# Patient Record
Sex: Female | Born: 1975 | Race: White | Hispanic: No | Marital: Single | Smoking: Current every day smoker
Health system: Southern US, Community
[De-identification: ages and names within clinical notes are randomized; demographics above are authoritative.]

## PROBLEM LIST (undated history)

## (undated) DIAGNOSIS — I10 Essential (primary) hypertension: Secondary | ICD-10-CM

---

## 2009-05-17 ENCOUNTER — Emergency Department (HOSPITAL_COMMUNITY): Admission: EM | Admit: 2009-05-17 | Discharge: 2009-05-17 | Payer: Self-pay | Admitting: Emergency Medicine

## 2010-11-25 LAB — URINALYSIS, ROUTINE W REFLEX MICROSCOPIC
Ketones, ur: NEGATIVE mg/dL
Protein, ur: NEGATIVE mg/dL
Urobilinogen, UA: 0.2 mg/dL (ref 0.0–1.0)

## 2010-11-25 LAB — CBC
HCT: 41.9 % (ref 36.0–46.0)
Hemoglobin: 13.8 g/dL (ref 12.0–15.0)
MCHC: 32.9 g/dL (ref 30.0–36.0)
RBC: 4.42 MIL/uL (ref 3.87–5.11)
RDW: 14.1 % (ref 11.5–15.5)

## 2010-11-25 LAB — COMPREHENSIVE METABOLIC PANEL
ALT: 15 U/L (ref 0–35)
Alkaline Phosphatase: 68 U/L (ref 39–117)
BUN: 5 mg/dL — ABNORMAL LOW (ref 6–23)
CO2: 22 mEq/L (ref 19–32)
GFR calc non Af Amer: >60 mL/min (ref >60)
Glucose, Bld: 98 mg/dL (ref 70–99)
Potassium: 3.5 mEq/L (ref 3.5–5.1)
Sodium: 137 mEq/L (ref 135–145)
Total Protein: 7.9 g/dL (ref 6.0–8.3)

## 2010-11-25 LAB — URINE MICROSCOPIC-ADD ON

## 2010-11-25 LAB — WET PREP, GENITAL: Yeast Wet Prep HPF POC: NONE SEEN

## 2010-11-25 LAB — POCT PREGNANCY, URINE: Preg Test, Ur: NEGATIVE

## 2010-11-25 LAB — DIFFERENTIAL
Basophils Absolute: 0.1 10*3/uL (ref 0.0–0.1)
Basophils Relative: 0 % (ref 0–1)
Eosinophils Absolute: 0.1 10*3/uL (ref 0.0–0.7)
Monocytes Relative: 7 % (ref 3–12)
Neutro Abs: 6.1 10*3/uL (ref 1.7–7.7)
Neutrophils Relative %: 49 % (ref 43–77)

## 2010-11-25 LAB — GC/CHLAMYDIA PROBE AMP, GENITAL: Chlamydia, DNA Probe: NEGATIVE

## 2013-01-15 ENCOUNTER — Encounter (HOSPITAL_COMMUNITY): Payer: Self-pay | Admitting: Emergency Medicine

## 2013-01-15 ENCOUNTER — Emergency Department (HOSPITAL_COMMUNITY)
Admission: EM | Admit: 2013-01-15 | Discharge: 2013-01-15 | Disposition: A | Discharge status: Home / Self Care | Payer: Medicaid Other | Source: Home / Self Care | Attending: Family Medicine | Admitting: Family Medicine

## 2013-01-15 DIAGNOSIS — L02219 Cutaneous abscess of trunk, unspecified: Secondary | ICD-10-CM

## 2013-01-15 DIAGNOSIS — R1900 Intra-abdominal and pelvic swelling, mass and lump, unspecified site: Secondary | ICD-10-CM

## 2013-01-15 DIAGNOSIS — L02214 Cutaneous abscess of groin: Secondary | ICD-10-CM

## 2013-01-15 HISTORY — DX: Essential (primary) hypertension: I10

## 2013-01-15 LAB — POCT URINALYSIS DIP (DEVICE)
Hgb urine dipstick: NEGATIVE
Ketones, ur: NEGATIVE mg/dL
Protein, ur: NEGATIVE mg/dL
Specific Gravity, Urine: 1.025 (ref 1.005–1.030)
Urobilinogen, UA: 0.2 mg/dL (ref 0.0–1.0)
pH: 7 (ref 5.0–8.0)

## 2013-01-15 LAB — POCT PREGNANCY, URINE: Preg Test, Ur: NEGATIVE

## 2013-01-15 MED ORDER — TRAMADOL HCL 50 MG PO TABS: 50.0000 mg | ORAL_TABLET | Freq: Four times a day (QID) | ORAL | Status: DC | PRN

## 2013-01-15 MED ORDER — DOXYCYCLINE HYCLATE 100 MG PO CAPS: 100.0000 mg | ORAL_CAPSULE | Freq: Two times a day (BID) | ORAL | Status: DC

## 2013-01-15 MED ORDER — IBUPROFEN 600 MG PO TABS: 600.0000 mg | ORAL_TABLET | Freq: Three times a day (TID) | ORAL | Status: DC | PRN

## 2013-01-15 NOTE — ED Provider Notes (Signed)
History     CSN: [id number]  Arrival date & time 01/15/13  1035   First MD Initiated Contact with Patient 01/15/13 1130      Chief Complaint  Patient presents with  . Abdominal Pain    (Consider location/radiation/quality/duration/timing/severity/associated sxs/prior treatment) HPI Comments: 37 year old smoker female here with the following complaints: #1) right groin pain and swelling for 2 days, area red and tender no spontaneous drainage. #2) patient is a [id number] (1 miscarriage and 8 medical elected abortions) was evaluated for right lower quadrant abdominal discomfort [facility] and "was told to followup at [facility] for follow up of a right lower quadrant mass". She reports long menstrual periods (more than 7 days) associated with cramping and heavy menstruation flow. Denies intermittent bleding. Denies current abdominal pain. Denies dysuria or hematuria. Denies vaginal discharge.LMP 12/22/12   Past Medical History  Diagnosis Date  . Hypertension     Past Surgical History  Procedure Laterality Date  . Cesarean section      No family history on file.  History  Substance Use Topics  . Smoking status: Current Every Day Smoker -- 0.25 packs/day    Types: Cigarettes  . Smokeless tobacco: Not on file  . Alcohol Use: 3.5 oz/week    7 drink(s) per week     Comment: 40 oz of alcohol per day    OB History   Grav Para Term Preterm Abortions TAB SAB Ect Mult Living                  Review of Systems  Constitutional: Negative for fever and chills.  Gastrointestinal: Negative for nausea, vomiting and diarrhea.       No current abdominal pain.  Endocrine: Negative for cold intolerance, heat intolerance, polydipsia, polyphagia and polyuria.  Genitourinary: Negative for urgency, frequency, hematuria, vaginal bleeding, vaginal discharge and pelvic pain.  Skin: Positive for rash.  Neurological: Negative for dizziness and headaches.  All  other systems reviewed and are negative.    Allergies  Review of patient's allergies indicates no known allergies.  Home Medications   Current Outpatient Rx  Name  Route  Sig  Dispense  Refill  . doxycycline (VIBRAMYCIN) 100 MG capsule   Oral   Take 1 capsule (100 mg total) by mouth 2 (two) times daily.   20 capsule   0   . ibuprofen (ADVIL,MOTRIN) 600 MG tablet   Oral   Take 1 tablet (600 mg total) by mouth every 8 (eight) hours as needed for pain.   20 tablet   0   . traMADol (ULTRAM) 50 MG tablet   Oral   Take 1 tablet (50 mg total) by mouth every 6 (six) hours as needed for pain.   15 tablet   0     BP 149/102  Pulse 88  Temp(Src) 98.5 F (36.9 C) (Oral)  Resp 16  SpO2 100%  LMP 12/22/2012  Physical Exam  Nursing note and vitals reviewed. Constitutional: She is oriented to person, place, and time. She appears well-developed and well-nourished. No distress.  HENT:  Head: Normocephalic and atraumatic.  Cardiovascular: Normal heart sounds.   Pulmonary/Chest: Breath sounds normal.  Abdominal: Soft. She exhibits mass. She exhibits no distension. There is no tenderness. There is no guarding.  There is palpable mass in right lower quadrant.  On bimanual exam uterus appears mobile, irregular and enlarged impress a large fibroid towards right side, possible subserosal fibroid as appears very mobile.  Do not impress at the expense of the adnexa. No significant tenderness reported during examination.  Neurological: She is alert and oriented to person, place, and time.  Skin: She is not diaphoretic.  There is a 2x3 cm indurated tender mass with central fluctuation in right lower pubic area close to the groin.     ED Course  INCISION AND DRAINAGE Performed by: Sharin Grave Authorized by: Sharin Grave Consent: Verbal consent obtained. Risks and benefits: risks, benefits and alternatives were discussed Consent given by: patient Patient understanding:  patient states understanding of the procedure being performed Patient consent: the patient's understanding of the procedure matches consent given Type: abscess Body area: anogenital Location details: vulva Anesthesia: local infiltration Local anesthetic: lidocaine 1% with epinephrine Anesthetic total: 2 ml Scalpel size: 11 Complexity: simple Drainage: purulent Drainage amount: moderate Packing material: 1/4 in iodoform gauze Patient tolerance: Patient tolerated the procedure well with no immediate complications.   (including critical care time)  Labs Reviewed  CULTURE, ROUTINE-ABSCESS  POCT URINALYSIS DIP (DEVICE)  POCT PREGNANCY, URINE   No results found.   1. Abscess of groin, right   2. Pelvic mass       MDM  #1: right groin abscess status post incision and drainage. Prescribe doxycycline ibuprofen and tramadol. Patient was asked to return tomorrow for packing removal and wound check.. #2 pelvic mass: Impress related urine fibroid. Patient was referred to [facility] hospital clinic as she would likely required a GYN ultrasound versus other imaging. No acute abdomen findings on examination. Patient with no abdominal pain today. Supportive care and red flags that should prompt her return to medical attention discussed with patient and provided in writing.        Sharin Grave, MD 01/16/13 1512

## 2013-01-15 NOTE — ED Notes (Signed)
Pt c/o RLQ abdominal pain x 2 months. Was seen at [facility] and told to follow up. Two weeks ago pt began to experience painful swelling in RLQ. Hurts to move. No problems with urinating, bowel movements, or menstural cycles. No fever. Pt reports she drinks 40 oz of alcohol per day. Patient is alert and oriented.

## 2013-01-18 LAB — CULTURE, ROUTINE-ABSCESS

## 2013-01-21 NOTE — ED Notes (Signed)
Chart review.

## 2013-03-02 ENCOUNTER — Emergency Department (HOSPITAL_COMMUNITY)
Admission: EM | Admit: 2013-03-02 | Discharge: 2013-03-02 | Disposition: A | Discharge status: Home / Self Care | Payer: Medicaid Other | Attending: Emergency Medicine | Admitting: Emergency Medicine

## 2013-03-02 ENCOUNTER — Emergency Department (HOSPITAL_COMMUNITY): Payer: Medicaid Other

## 2013-03-02 ENCOUNTER — Encounter (HOSPITAL_COMMUNITY): Payer: Self-pay | Admitting: *Deleted

## 2013-03-02 DIAGNOSIS — R350 Frequency of micturition: Secondary | ICD-10-CM | POA: Insufficient documentation

## 2013-03-02 DIAGNOSIS — D259 Leiomyoma of uterus, unspecified: Secondary | ICD-10-CM

## 2013-03-02 DIAGNOSIS — R112 Nausea with vomiting, unspecified: Secondary | ICD-10-CM | POA: Insufficient documentation

## 2013-03-02 DIAGNOSIS — IMO0002 Reserved for concepts with insufficient information to code with codable children: Secondary | ICD-10-CM | POA: Insufficient documentation

## 2013-03-02 DIAGNOSIS — I10 Essential (primary) hypertension: Secondary | ICD-10-CM | POA: Insufficient documentation

## 2013-03-02 DIAGNOSIS — R300 Dysuria: Secondary | ICD-10-CM | POA: Insufficient documentation

## 2013-03-02 DIAGNOSIS — F17200 Nicotine dependence, unspecified, uncomplicated: Secondary | ICD-10-CM | POA: Insufficient documentation

## 2013-03-02 DIAGNOSIS — D649 Anemia, unspecified: Secondary | ICD-10-CM | POA: Insufficient documentation

## 2013-03-02 DIAGNOSIS — R109 Unspecified abdominal pain: Secondary | ICD-10-CM

## 2013-03-02 DIAGNOSIS — Z3202 Encounter for pregnancy test, result negative: Secondary | ICD-10-CM | POA: Insufficient documentation

## 2013-03-02 DIAGNOSIS — Z87442 Personal history of urinary calculi: Secondary | ICD-10-CM | POA: Insufficient documentation

## 2013-03-02 LAB — CBC WITH DIFFERENTIAL/PLATELET
Basophils Relative: 0 % (ref 0–1)
Eosinophils Absolute: 0 10*3/uL (ref 0.0–0.7)
Hemoglobin: 8.5 g/dL — ABNORMAL LOW (ref 12.0–15.0)
Lymphs Abs: 2.8 10*3/uL (ref 0.7–4.0)
MCH: 20.4 pg — ABNORMAL LOW (ref 26.0–34.0)
Neutro Abs: 5.6 10*3/uL (ref 1.7–7.7)
Neutrophils Relative %: 63 % (ref 43–77)
Platelets: 372 10*3/uL (ref 150–400)
RBC: 4.17 MIL/uL (ref 3.87–5.11)

## 2013-03-02 LAB — URINALYSIS, ROUTINE W REFLEX MICROSCOPIC
Bilirubin Urine: NEGATIVE
Glucose, UA: NEGATIVE mg/dL
Ketones, ur: NEGATIVE mg/dL
pH: 6 (ref 5.0–8.0)

## 2013-03-02 LAB — COMPREHENSIVE METABOLIC PANEL
ALT: 14 U/L (ref 0–35)
Albumin: 3.6 g/dL (ref 3.5–5.2)
Alkaline Phosphatase: 79 U/L (ref 39–117)
Chloride: 101 mEq/L (ref 96–112)
Glucose, Bld: 130 mg/dL — ABNORMAL HIGH (ref 70–99)
Potassium: 3.3 mEq/L — ABNORMAL LOW (ref 3.5–5.1)
Sodium: 135 mEq/L (ref 135–145)
Total Bilirubin: 0.3 mg/dL (ref 0.3–1.2)
Total Protein: 7.4 g/dL (ref 6.0–8.3)

## 2013-03-02 LAB — WET PREP, GENITAL

## 2013-03-02 LAB — POCT PREGNANCY, URINE: Preg Test, Ur: NEGATIVE

## 2013-03-02 MED ORDER — IOHEXOL 300 MG/ML  SOLN
100.0000 mL | Freq: Once | INTRAMUSCULAR | Status: AC | PRN
  Administered 2013-03-02: 100 mL via INTRAVENOUS

## 2013-03-02 MED ORDER — FERROUS SULFATE 325 (65 FE) MG PO TABS: 325.0000 mg | ORAL_TABLET | Freq: Every day | ORAL | Status: DC

## 2013-03-02 MED ORDER — MORPHINE SULFATE 4 MG/ML IJ SOLN
4.0000 mg | Freq: Once | INTRAMUSCULAR | Status: AC
  Administered 2013-03-02: 4 mg via INTRAVENOUS
  Filled 2013-03-02: qty 1

## 2013-03-02 MED ORDER — DOCUSATE SODIUM 100 MG PO CAPS: 100.0000 mg | ORAL_CAPSULE | Freq: Two times a day (BID) | ORAL | Status: DC

## 2013-03-02 MED ORDER — ONDANSETRON HCL 4 MG/2ML IJ SOLN
4.0000 mg | Freq: Once | INTRAMUSCULAR | Status: AC
  Administered 2013-03-02: 4 mg via INTRAVENOUS
  Filled 2013-03-02: qty 2

## 2013-03-02 MED ORDER — IBUPROFEN 800 MG PO TABS: 800.0000 mg | ORAL_TABLET | Freq: Three times a day (TID) | ORAL | Status: DC

## 2013-03-02 MED ORDER — IOHEXOL 300 MG/ML  SOLN
25.0000 mL | INTRAMUSCULAR | Status: DC | PRN
  Administered 2013-03-02: 25 mL via ORAL

## 2013-03-02 NOTE — ED Notes (Signed)
Pt is here with right lower abdominal pain that radiates into her back for the last 4 days.  LMP 6/31/14

## 2013-03-02 NOTE — ED Notes (Signed)
Pt stated she "went through this about a month ago at the health clinic and everything was fine. But this pain won't go away."

## 2013-03-02 NOTE — ED Provider Notes (Signed)
History    CSN: [id number] Arrival date & time 03/02/13  1139  First MD Initiated Contact with Patient 03/02/13 1209     Chief Complaint  Patient presents with  . Abdominal Pain   (Consider location/radiation/quality/duration/timing/severity/associated sxs/prior Treatment) HPI Julie Roberson is a(n) 37 y.o. female who presents  Chief complaint of right lower abdominal pain and right flank pain. Ten systems reviewed and are negative for acute change, except as noted in the HPI.  States that she has had intermittent pain on the right side for the past 4 months however over the past 4 days it has become acutely severe.  She states that Sunday she had nausea and  nonbilious nonbloody vomiting associated.  She has no further episodes of this.  Patient complains of dysuria and frequency.  She states that her pain is worse with urination.  She states that it is squeezing and feels that it radiates into her back.  She does have a history of kidney stones however states that this is different.  She denies hematuria, foul odor of urine, urgency.  Patient's last menstrual period was 6/31/2014.  Patient is sexually active with one single female partner.  Her partner has had a facetectomy and that is her means of birth control.  She states that her period.  Are regular.  She has noticed some dyspareunia as worsening over the past week.  She states that she is achy after sex and during her last intercourse had some spotting which is new to her.  Denies fevers, chills, myalgias, arthralgias. Denies DOE, SOB, chest tightness or pressure, radiation to left arm, jaw or back, or diaphoresis. Denies headaches, light headedness, weakness, visual disturbances.  Past Medical History  Diagnosis Date  . Hypertension    Past Surgical History  Procedure Laterality Date  . Cesarean section     No family history on file. History  Substance Use Topics  . Smoking status: Current Every Day Smoker -- 0.25 packs/day    Types: Cigarettes  . Smokeless tobacco: Not on file  . Alcohol Use: 3.5 oz/week    7 drink(s) per week     Comment: 40 oz of alcohol per day   OB History   Grav Para Term Preterm Abortions TAB SAB Ect Mult Living                 Review of Systems Ten systems reviewed and are negative for acute change, except as noted in the HPI.   Allergies  Review of patient's allergies indicates no known allergies.  Home Medications   Current Outpatient Rx  Name  Route  Sig  Dispense  Refill  . ibuprofen (ADVIL,MOTRIN) 600 MG tablet   Oral   Take 1 tablet (600 mg total) by mouth every 8 (eight) hours as needed for pain.   20 tablet   0    BP 182/108  Pulse 95  Temp(Src) 98.3 F (36.8 C)  Resp 18  SpO2 100% Physical Exam Nursing note and vitals reviewed. Constitutional: She is oriented to person, place, and time. She appears well-developed and well-nourished. No distress.  HENT:  Head: Normocephalic and atraumatic.  Eyes: Conjunctivae normal and EOM are normal. Pupils are equal, round, and reactive to light. No scleral icterus.  Neck: Normal range of motion.  Cardiovascular: Normal rate, regular rhythm and normal heart sounds.  Exam reveals no gallop and no friction rub.   No murmur heard. Pulmonary/Chest: Effort normal and breath sounds normal. No respiratory distress.  Abdominal: No tenderness to deep palpation of the abdomen.  Patient is tender to superficial palpation in the right lower rectus abdominis.  It is more firm than the opposite side.  The patient has rectus diastases.  No tenderness in the iliopsoas region.  No CVA tenderness.  No heat redness, swelling or discharge.  No signs of infection. Neurological: She is alert and oriented to person, place, and time.  Skin: Skin is warm and dry. She is not diaphoretic.    ED Course  Procedures (including critical care time) Labs Reviewed  CBC WITH DIFFERENTIAL - Abnormal; Notable for the following:    Hemoglobin 8.5 (*)     HCT 28.5 (*)    MCV 68.3 (*)    MCH 20.4 (*)    MCHC 29.8 (*)    RDW 21.7 (*)    All other components within normal limits  COMPREHENSIVE METABOLIC PANEL - Abnormal; Notable for the following:    Potassium 3.3 (*)    CO2 18 (*)    Glucose, Bld 130 (*)    BUN 4 (*)    All other components within normal limits  URINALYSIS, ROUTINE W REFLEX MICROSCOPIC - Abnormal; Notable for the following:    Specific Gravity, Urine 1.004 (*)    All other components within normal limits  WET PREP, GENITAL  GC/CHLAMYDIA PROBE AMP  POCT PREGNANCY, URINE    Pelvic exam: VULVA: normal appearing vulva with no masses, tenderness or lesions, VAGINA: normal appearing vagina with normal color and discharge, no lesions, CERVIX: normal appearing cervix without discharge or lesions, multiparous os, UTERUS: Not palpabale above pubis, ADNEXA: mass present right side, size 10 cm, exam chaperoned by Julaine Hua.  No results found. 1. Uterine fibroid   2. Anemia   3. Abdominal pain   4. Hypertension     MDM  12:51 PM Filed Vitals:   03/02/13 1143  BP: 182/108  Pulse: 95  Temp: 98.3 F (36.8 C)  Resp: 18   Patient here with complaint of pain.  Her CBC shows she is in a hemoglobin of 8.5.  Last documented hemoglobin was in 2010 and was 13.8 at that time. Patient complains that she has had heavier periods that are lasting longer than normal.  1:55 PM BP 182/108  Pulse 95  Temp(Src) 98.3 F (36.8 C)  Resp 18  SpO2 100% Pelvic exam performed.  Mass is palpable in the right adnexa when pressed it down into the pelvic region.  The patient is very tender when this is done.  It appears to feel approximately 10 cm in size.  Movable.   Filed Vitals:   03/02/13 1422 03/02/13 1424 03/02/13 1430 03/02/13 1500  BP: 157/105  164/105 161/95  Pulse:  75 71 64  Temp:      Resp:      SpO2:  99% 100% 100%    Patient ct/ US show large uterine fibroids. I so not suspect ovarian torsion as the patient's presentation  does not support this diagnosis.  Patient will be discharged with ibuprofen, iron, and colace. She will follow up with [facility] and  [facility]. It also appears to be hypertensive.  Discuss this with her.  She will follow up with community health and wellness.  SHe'll need her hemoglobin and blood pressure checked. Return precautions discussed.  Patient expresses understanding and agrees with plan.  Arthor Captain, PA-C 03/02/13 1955  Arthor Captain, PA-C 03/02/13 1955

## 2013-03-02 NOTE — ED Provider Notes (Signed)
Patient reports tenderness in her right lower abdomen with a small mass that is getting bigger. She relates she noted it about 4 months ago. She denies any fever. It is just superior to an old C-section scar. She denies fever, nausea, or vomiting.  Patient has a proximally 4 cm x 2 cm firm mass that feels like it's in the subcutaneous tissue in the right lower quadrant of the abdomen just superior and lateral to the lateral edge of her C-section scar. It is not in the inguinal region. It appears to be painful to palpation.   Medical screening examination/treatment/procedure(s) were conducted as a shared visit with non-physician practitioner(s) and myself.  I personally evaluated the patient during the encounter  Devoria Albe, MD, Franz Dell, MD 03/02/13 [phone]

## 2013-03-03 LAB — GC/CHLAMYDIA PROBE AMP
CT Probe RNA: NEGATIVE
GC Probe RNA: NEGATIVE

## 2013-03-03 NOTE — ED Provider Notes (Signed)
See prior note   Ward Givens, MD 03/03/13 1304

## 2015-01-06 IMAGING — CT CT ABD-PELV W/ CM
2 of 4 series · 17 of 46 positions shown, 19 images · IV contrast (CONTRAST)
Comparison: Pelvic ultrasound today

CLINICAL DATA: Abdominal pain.

CT ABDOMEN AND PELVIS WITH CONTRAST
TECHNIQUE: Multidetector CT imaging of the abdomen and pelvis was
performed following the standard protocol during bolus
administration of intravenous contrast.
Contrast: 100mL OMNIPAQUE IOHEXOL 300 MG/ML  SOLN

[Series 2: routine · axial · 0.81mm/px · z∈[-444,-64]mm · 14 of 84 slices shown, 16 images]
[im 4/84  soft-tissue]
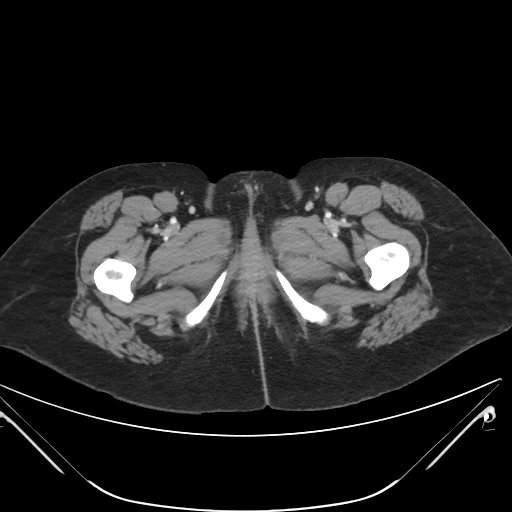
[im 4/84  bone]
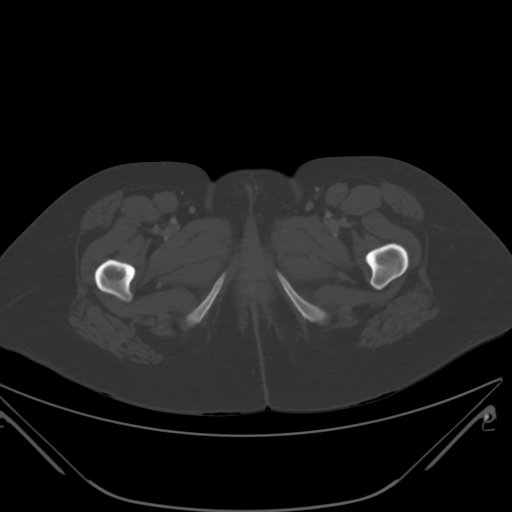
[im 11/84  soft-tissue]
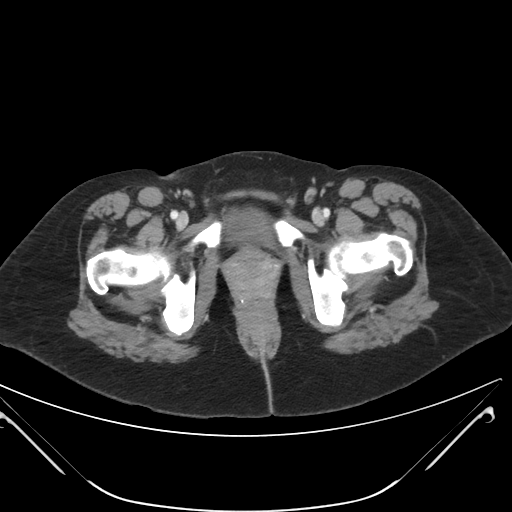
[im 18/84  soft-tissue]
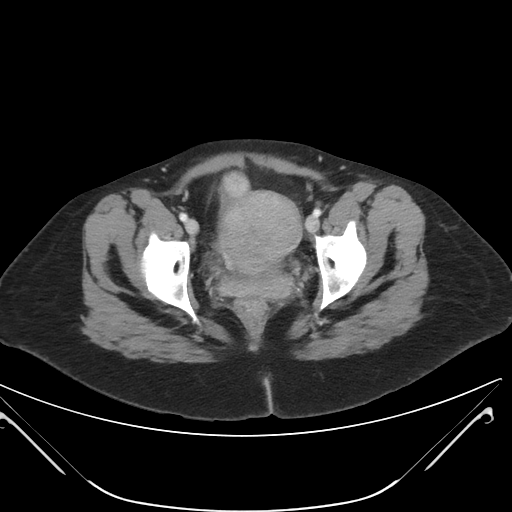
[im 21/84  soft-tissue]
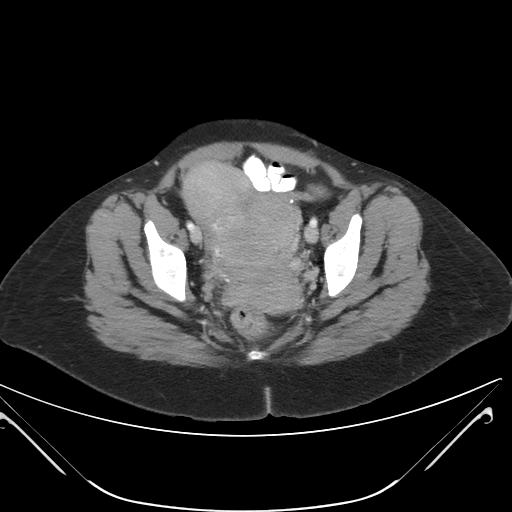
[im 28/84  soft-tissue]
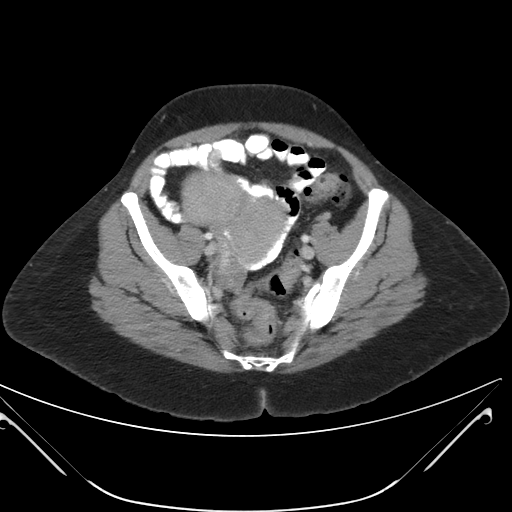
[im 35/84  soft-tissue]
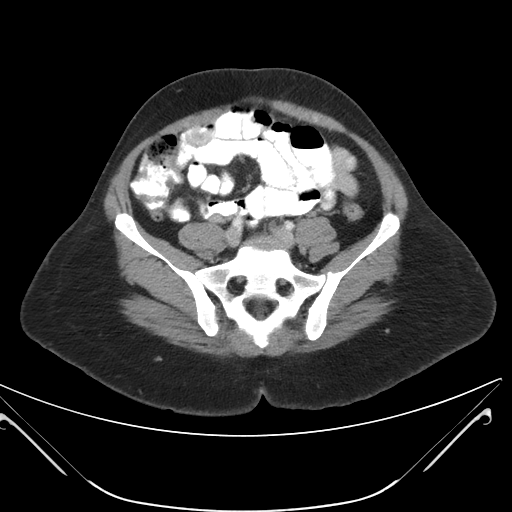
[im 39/84  soft-tissue]
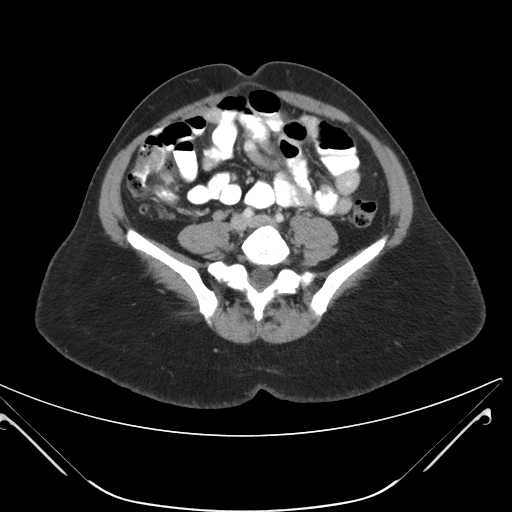
[im 45/84  soft-tissue]
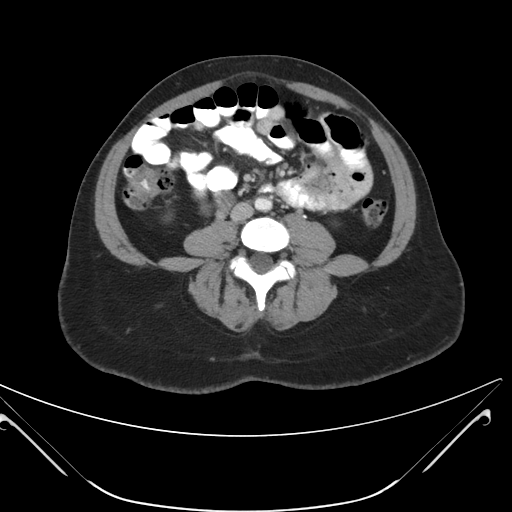
[im 49/84  soft-tissue]
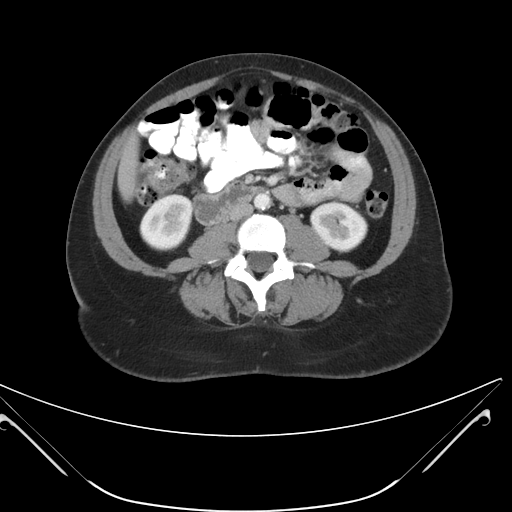
[im 49/84  bone]
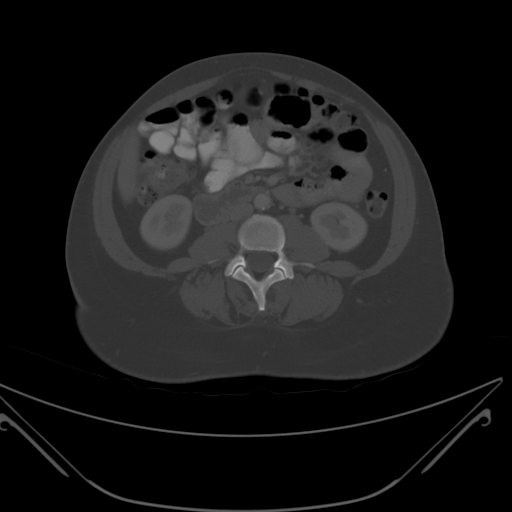
[im 56/84  soft-tissue]
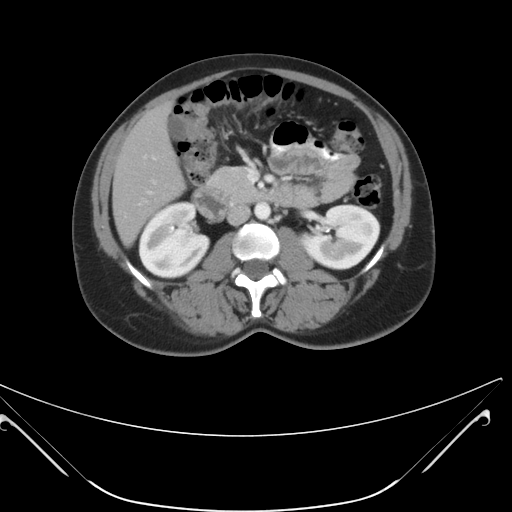
[im 63/84  soft-tissue]
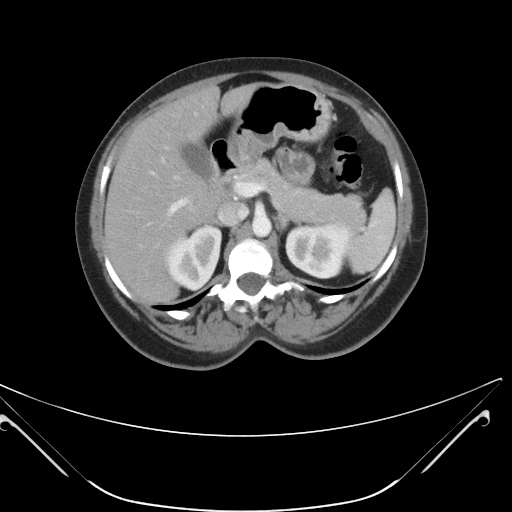
[im 66/84  soft-tissue]
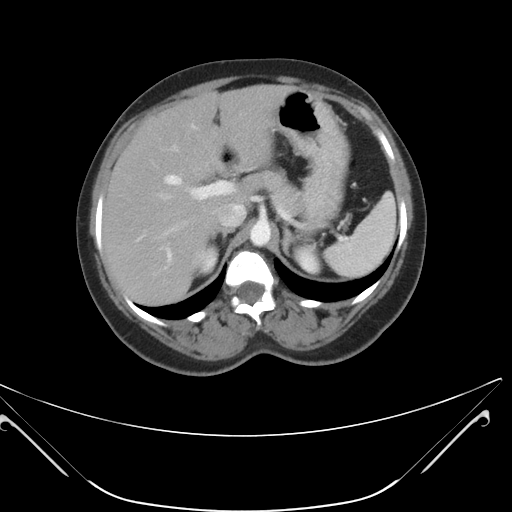
[im 73/84  soft-tissue]
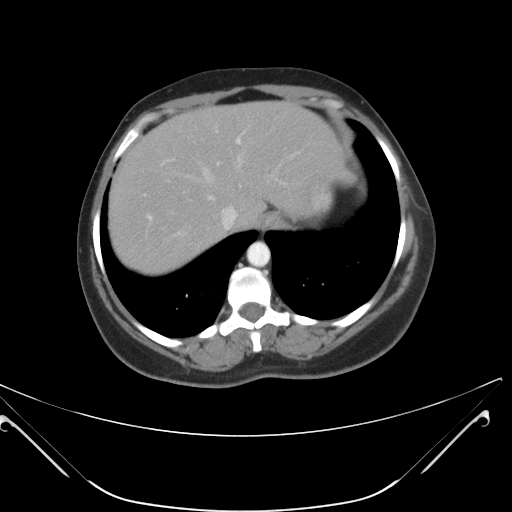
[im 80/84  soft-tissue]
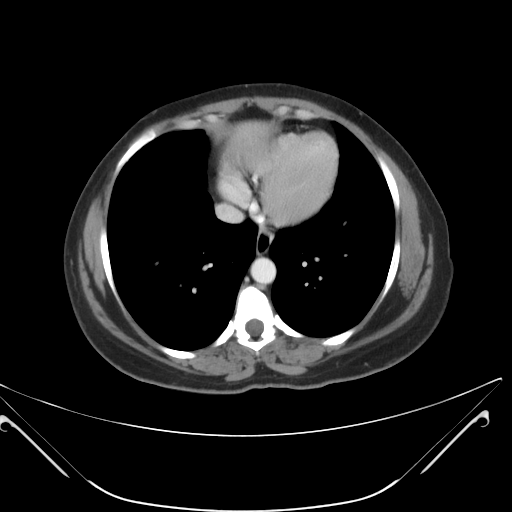

[mpr, coronals, coronal · coronal · 0.81mm/px · 3 of 97 slices shown]
[im 33/97  soft-tissue]
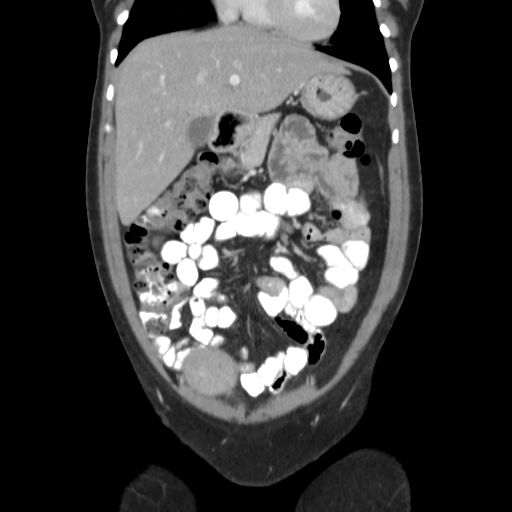
[im 43/97  soft-tissue]
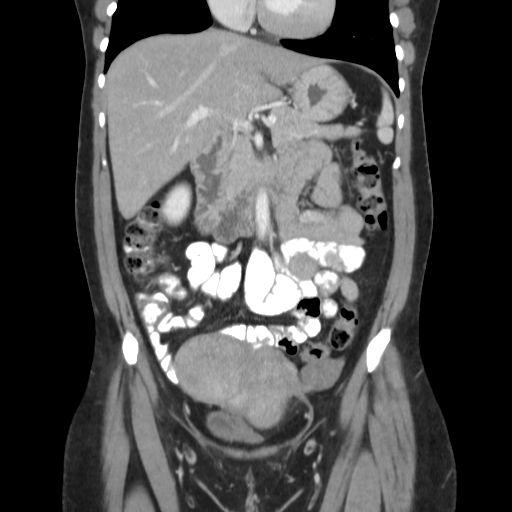
[im 54/97  soft-tissue]
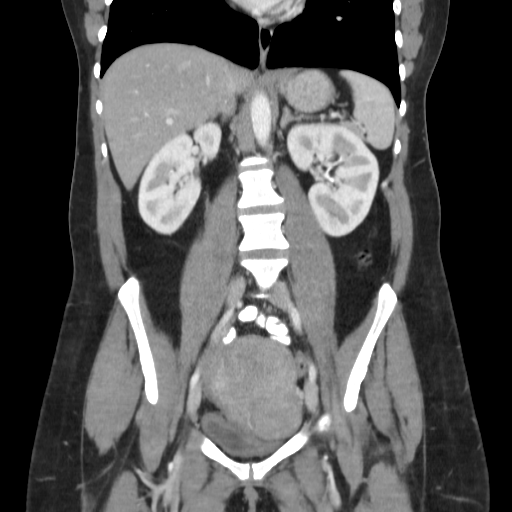

[17 of 46 positions shown; findings below may reference images not displayed]

FINDINGS: Lung bases are clear.  Liver gallbladder and bile ducts
are normal.  Pancreas and spleen are normal.  The kidneys are
normal without obstruction or mass lesion.  Mild aortic
atherosclerotic disease without aneurysm.

Negative for bowel obstruction or bowel thickening.  Normal
appendix.

Multiple uterine fibroids are present.  The largest fibroid in the
right fundus measures 6.2 cm.  Additional 5 cm fibroids in the body
of the uterus.  Lower uterine segment fibroid on the left measures
5.6 cm.

No free fluid.  No adenopathy.
IMPRESSION: Uterine fibroids.  Otherwise no acute abnormality.

## 2015-02-05 ENCOUNTER — Emergency Department (HOSPITAL_COMMUNITY)
Admission: EM | Admit: 2015-02-05 | Discharge: 2015-02-05 | Disposition: A | Discharge status: Home / Self Care | Payer: Medicaid Other | Attending: Emergency Medicine | Admitting: Emergency Medicine

## 2015-02-05 ENCOUNTER — Encounter (HOSPITAL_COMMUNITY): Payer: Self-pay | Admitting: *Deleted

## 2015-02-05 ENCOUNTER — Emergency Department (HOSPITAL_COMMUNITY)
Admission: EM | Admit: 2015-02-05 | Discharge: 2015-02-05 | Disposition: A | Discharge status: Nursing Home (Includes ICF) | Payer: Medicaid Other | Source: Home / Self Care | Attending: Family Medicine | Admitting: Family Medicine

## 2015-02-05 ENCOUNTER — Encounter (HOSPITAL_COMMUNITY): Payer: Self-pay

## 2015-02-05 DIAGNOSIS — R110 Nausea: Secondary | ICD-10-CM | POA: Insufficient documentation

## 2015-02-05 DIAGNOSIS — N898 Other specified noninflammatory disorders of vagina: Secondary | ICD-10-CM | POA: Diagnosis not present

## 2015-02-05 DIAGNOSIS — R1030 Lower abdominal pain, unspecified: Secondary | ICD-10-CM | POA: Insufficient documentation

## 2015-02-05 DIAGNOSIS — Z3202 Encounter for pregnancy test, result negative: Secondary | ICD-10-CM | POA: Insufficient documentation

## 2015-02-05 DIAGNOSIS — K5900 Constipation, unspecified: Secondary | ICD-10-CM | POA: Diagnosis not present

## 2015-02-05 DIAGNOSIS — I10 Essential (primary) hypertension: Secondary | ICD-10-CM | POA: Diagnosis not present

## 2015-02-05 DIAGNOSIS — Z720 Tobacco use: Secondary | ICD-10-CM | POA: Diagnosis not present

## 2015-02-05 DIAGNOSIS — Z79899 Other long term (current) drug therapy: Secondary | ICD-10-CM | POA: Insufficient documentation

## 2015-02-05 DIAGNOSIS — R109 Unspecified abdominal pain: Secondary | ICD-10-CM

## 2015-02-05 DIAGNOSIS — Z9889 Other specified postprocedural states: Secondary | ICD-10-CM | POA: Insufficient documentation

## 2015-02-05 DIAGNOSIS — R1900 Intra-abdominal and pelvic swelling, mass and lump, unspecified site: Secondary | ICD-10-CM

## 2015-02-05 LAB — COMPREHENSIVE METABOLIC PANEL
ALBUMIN: 3.6 g/dL (ref 3.5–5.0)
ALT: 23 U/L (ref 14–54)
ANION GAP: 7 (ref 5–15)
AST: 40 U/L (ref 15–41)
Alkaline Phosphatase: 64 U/L (ref 38–126)
BILIRUBIN TOTAL: 0.5 mg/dL (ref 0.3–1.2)
BUN: <5 mg/dL — ABNORMAL LOW (ref 6–20)
CALCIUM: 9.8 mg/dL (ref 8.9–10.3)
CHLORIDE: 111 mmol/L (ref 101–111)
CO2: 20 mmol/L — ABNORMAL LOW (ref 22–32)
CREATININE: 0.45 mg/dL (ref 0.44–1.00)
GFR calc Af Amer: >60 mL/min (ref >60)
GFR calc non Af Amer: >60 mL/min (ref >60)
Glucose, Bld: 83 mg/dL (ref 65–99)
Potassium: 3.7 mmol/L (ref 3.5–5.1)
SODIUM: 138 mmol/L (ref 135–145)
Total Protein: 7 g/dL (ref 6.5–8.1)

## 2015-02-05 LAB — CBC WITH DIFFERENTIAL/PLATELET
BASOS ABS: 0.1 10*3/uL (ref 0.0–0.1)
Basophils Relative: 1 % (ref 0–1)
EOS ABS: 0 10*3/uL (ref 0.0–0.7)
Eosinophils Relative: 0 % (ref 0–5)
HCT: 27.4 % — ABNORMAL LOW (ref 36.0–46.0)
Hemoglobin: 8.3 g/dL — ABNORMAL LOW (ref 12.0–15.0)
LYMPHS PCT: 31 % (ref 12–46)
Lymphs Abs: 3.4 10*3/uL (ref 0.7–4.0)
MCH: 23.2 pg — ABNORMAL LOW (ref 26.0–34.0)
MCHC: 30.3 g/dL (ref 30.0–36.0)
MCV: 76.5 fL — ABNORMAL LOW (ref 78.0–100.0)
Monocytes Absolute: 0.8 10*3/uL (ref 0.1–1.0)
Monocytes Relative: 7 % (ref 3–12)
NEUTROS PCT: 61 % (ref 43–77)
Neutro Abs: 6.7 10*3/uL (ref 1.7–7.7)
Platelets: 221 10*3/uL (ref 150–400)
RBC: 3.58 MIL/uL — ABNORMAL LOW (ref 3.87–5.11)
RDW: 30 % — AB (ref 11.5–15.5)
WBC: 11 10*3/uL — AB (ref 4.0–10.5)

## 2015-02-05 LAB — POCT URINALYSIS DIP (DEVICE)
Bilirubin Urine: NEGATIVE
GLUCOSE, UA: NEGATIVE mg/dL
HGB URINE DIPSTICK: NEGATIVE
Ketones, ur: NEGATIVE mg/dL
Leukocytes, UA: NEGATIVE
NITRITE: NEGATIVE
PROTEIN: NEGATIVE mg/dL
UROBILINOGEN UA: 0.2 mg/dL (ref 0.0–1.0)
pH: 6 (ref 5.0–8.0)

## 2015-02-05 LAB — URINALYSIS, ROUTINE W REFLEX MICROSCOPIC
Bilirubin Urine: NEGATIVE
Glucose, UA: NEGATIVE mg/dL
Hgb urine dipstick: NEGATIVE
KETONES UR: NEGATIVE mg/dL
LEUKOCYTES UA: NEGATIVE
NITRITE: NEGATIVE
PROTEIN: NEGATIVE mg/dL
Specific Gravity, Urine: 1.008 (ref 1.005–1.030)
UROBILINOGEN UA: 0.2 mg/dL (ref 0.0–1.0)
pH: 5.5 (ref 5.0–8.0)

## 2015-02-05 LAB — CBC AND DIFFERENTIAL
HEMATOCRIT: 27 % — AB (ref 36–46)
Hemoglobin: 8.3 g/dL — AB (ref 12.0–16.0)
WBC: 11 10*3/mL

## 2015-02-05 LAB — POCT PREGNANCY, URINE: Preg Test, Ur: NEGATIVE

## 2015-02-05 LAB — POC URINE PREG, ED: Preg Test, Ur: NEGATIVE

## 2015-02-05 LAB — WET PREP, GENITAL
TRICH WET PREP: NONE SEEN
Yeast Wet Prep HPF POC: NONE SEEN

## 2015-02-05 LAB — LIPASE, BLOOD: LIPASE: 183 U/L — AB (ref 22–51)

## 2015-02-05 LAB — BASIC METABOLIC PANEL: BUN: 5 mg/dL (ref 4–21)

## 2015-02-05 MED ORDER — FENTANYL CITRATE (PF) 100 MCG/2ML IJ SOLN
50.0000 ug | Freq: Once | INTRAMUSCULAR | Status: AC
  Administered 2015-02-05: 50 ug via INTRAVENOUS
  Filled 2015-02-05: qty 2

## 2015-02-05 NOTE — ED Notes (Signed)
Pt reports lower abdominal cramping with N/V x 3 weeks with abdominal bloating. Pt reports yellow vaginal discharge. Denies urinary symptoms. Denies fever/chills.

## 2015-02-05 NOTE — ED Provider Notes (Signed)
CSN: [id number]     Arrival date & time 02/05/15  1442 History   This chart was scribed for Comer Locket, PA-C working with No att. providers found by Mercy Moore, ED Scribe. This patient was seen in room TR01C/TR01C and the patient's care was started at 6:02 PM.   Chief Complaint  Patient presents with  . Abdominal Pain   The history is provided by the patient. No language interpreter was used.   HPI Comments: Julie Roberson is a 39 y.o. female with PMHx of Hypertension who presents to the Emergency Department as a transfer from [facility] for further evaluation of her suprapubic abdominal pain. Associated symptoms include vomiting, diarrhea, yellow vaginal discharge, and abnormal recent menstrual period lasting for 12 days. Patient states that she suspects she has an umnilical hernia reporting protrusion of her belly button. Patient reports aggravation of her abdominal pain with sitting; she reports that pain medication and beer distract her enough so she can sleep, she denies alleviating factors. Patient denies association of her abdominal pain with eating.  Patient evaluated at [facility] today for complaint of worsening suprapubic, cramping abdominal pain ongoing for three weeks; diagnosed with pelvic mass.  Patient reports that she's scheduled an appointment at [facility] for August; the earliest they could get her in.   Past Medical History  Diagnosis Date  . Hypertension    Past Surgical History  Procedure Laterality Date  . Cesarean section     No family history on file. History  Substance Use Topics  . Smoking status: Current Every Day Smoker -- 0.25 packs/day    Types: Cigarettes  . Smokeless tobacco: Not on file  . Alcohol Use: 3.5 oz/week    7 drink(s) per week     Comment: 40 oz of alcohol per day   OB History    No data available     Review of Systems  Constitutional: Negative for fever and chills.  HENT: Negative for congestion, rhinorrhea and  sore throat.   Eyes: Negative for visual disturbance.  Respiratory: Negative for cough, shortness of breath and wheezing.   Gastrointestinal: Positive for nausea, abdominal pain and constipation. Negative for vomiting and diarrhea.  Endocrine: Negative for polyuria.  Genitourinary: Positive for vaginal discharge and menstrual problem. Negative for dysuria and hematuria.  Musculoskeletal: Negative for back pain and joint swelling.  Skin: Negative for rash.  Allergic/Immunologic: Negative for immunocompromised state.  Neurological: Negative for headaches.  Hematological: Does not bruise/bleed easily.  Psychiatric/Behavioral: Negative for confusion.  All other systems reviewed and are negative.     Allergies  Review of patient's allergies indicates no known allergies.  Home Medications   Prior to Admission medications   Medication Sig Start Date End Date Taking? Authorizing Provider  docusate sodium (COLACE) 100 MG capsule Take 1 capsule (100 mg total) by mouth every 12 (twelve) hours. 03/02/13   Margarita Mail, PA-C  ferrous sulfate 325 (65 FE) MG tablet Take 1 tablet (325 mg total) by mouth daily with breakfast. 03/02/13   Margarita Mail, PA-C  ibuprofen (ADVIL,MOTRIN) 600 MG tablet Take 1 tablet (600 mg total) by mouth every 8 (eight) hours as needed for pain. 01/15/13   Adlih Moreno-Coll, MD  ibuprofen (ADVIL,MOTRIN) 800 MG tablet Take 1 tablet (800 mg total) by mouth 3 (three) times daily. 03/02/13   Margarita Mail, PA-C   Triage Vitals: BP 176/106 mmHg  Pulse 84  Temp(Src) 98.8 F (37.1 C) (Oral)  Resp 18  SpO2 100%  LMP 01/08/2015 Physical Exam  Constitutional: She is oriented to person, place, and time. She appears well-developed and well-nourished. No distress.  HENT:  Head: Normocephalic and atraumatic.  Eyes: EOM are normal.  Neck: Neck supple. No tracheal deviation present.  Cardiovascular: Normal rate and regular rhythm.   Pulmonary/Chest: Effort normal and breath  sounds normal. No respiratory distress. She has no wheezes. She has no rales.  Abdominal: Soft. There is tenderness.  Soft, with tenderness over suprapubic reigon. No other lesions or deformities.   Musculoskeletal: Normal range of motion.  Neurological: She is alert and oriented to person, place, and time.  Skin: Skin is warm and dry.  Psychiatric: She has a normal mood and affect. Her behavior is normal.  Nursing note and vitals reviewed.   ED Course  Procedures (including critical care time)  COORDINATION OF CARE: 6:14 PM- Pelvic exam performed. Discussed treatment plan with patient at bedside and patient agreed to plan.   Labs Review Labs Reviewed  WET PREP, GENITAL - Abnormal; Notable for the following:    Clue Cells Wet Prep HPF POC FEW (*)    WBC, Wet Prep HPF POC RARE (*)    All other components within normal limits  CBC WITH DIFFERENTIAL/PLATELET - Abnormal; Notable for the following:    WBC 11.0 (*)    RBC 3.58 (*)    Hemoglobin 8.3 (*)    HCT 27.4 (*)    MCV 76.5 (*)    MCH 23.2 (*)    RDW 30.0 (*)    All other components within normal limits  COMPREHENSIVE METABOLIC PANEL - Abnormal; Notable for the following:    CO2 20 (*)    BUN <5 (*)    All other components within normal limits  LIPASE, BLOOD - Abnormal; Notable for the following:    Lipase 183 (*)    All other components within normal limits  URINALYSIS, ROUTINE W REFLEX MICROSCOPIC (NOT AT [facility])  POC URINE PREG, ED  GC/CHLAMYDIA PROBE AMP ([place]) NOT AT [facility]    Imaging Review No results found.   EKG Interpretation None     Meds given in ED:  Medications  fentaNYL (SUBLIMAZE) injection 50 mcg (50 mcg Intravenous Given 02/05/15 1828)    Discharge Medication List as of 02/05/2015  6:57 PM     Filed Vitals:   02/05/15 1454 02/05/15 1708  BP: 157/103 176/106  Pulse: 84 84  Temp: 98.8 F (37.1 C)   TempSrc: Oral   Resp: 20 18  SpO2: 100% 100%    MDM  Vitals stable -afebrile Pt  resting comfortably in ED. Labs are noncontributory. DDX--patient with nonspecific lower abdominal cramping with intermittent nausea and vomiting for the past 3 weeks. Labs are noncontributory. Benign abdominal exam. Low suspicion for appendicitis. Patient feels much better after administration of analgesia in the ED. States she would prefer to follow up with gynecology for removal of fibroids. Does not want to have further imaging completed at this time.  I discussed all relevant lab findings and imaging results with pt and they verbalized understanding. Discussed f/u with PCP within 48 hrs and return precautions, pt very amenable to plan. Patient stable, in good condition and ambulates out of the ED without difficulty.  Final diagnoses:  Abdominal discomfort   I personally performed the services described in this documentation, which was scribed in my presence. The recorded information has been reviewed and is accurate.    Comer Locket, PA-C 02/05/15 2259  Elnora Morrison, MD  02/06/15 0134 

## 2015-02-05 NOTE — ED Provider Notes (Addendum)
CSN: [id number]     Arrival date & time 02/05/15  1328 History   First MD Initiated Contact with Patient 02/05/15 1410     Chief Complaint  Patient presents with  . Abdominal Pain   (Consider location/radiation/quality/duration/timing/severity/associated sxs/prior Treatment) Patient is a 39 y.o. female presenting with abdominal pain. The history is provided by the patient.  Abdominal Pain Pain location:  Suprapubic Pain quality: cramping   Pain radiates to:  Back Onset quality:  Gradual Duration:  3 weeks Progression:  Worsening Chronicity:  New Context comment:  Lmp lasting 12d was 5/20. Associated symptoms: constipation, nausea and vaginal discharge   Associated symptoms: no dysuria, no fever, no vaginal bleeding and no vomiting   Risk factors comment:  H/o fibroids. told it would go away.   Past Medical History  Diagnosis Date  . Hypertension    Past Surgical History  Procedure Laterality Date  . Cesarean section     History reviewed. No pertinent family history. History  Substance Use Topics  . Smoking status: Current Every Day Smoker -- 0.25 packs/day    Types: Cigarettes  . Smokeless tobacco: Not on file  . Alcohol Use: 3.5 oz/week    7 drink(s) per week     Comment: 40 oz of alcohol per day   OB History    No data available     Review of Systems  Constitutional: Negative for fever.  Gastrointestinal: Positive for nausea, abdominal pain and constipation. Negative for vomiting.  Genitourinary: Positive for vaginal discharge, menstrual problem and pelvic pain. Negative for dysuria and vaginal bleeding.    Allergies  Review of patient's allergies indicates no known allergies.  Home Medications   Prior to Admission medications   Medication Sig Start Date End Date Taking? Authorizing Provider  docusate sodium (COLACE) 100 MG capsule Take 1 capsule (100 mg total) by mouth every 12 (twelve) hours. 03/02/13   Margarita Mail, PA-C  ferrous sulfate 325 (65 FE) MG  tablet Take 1 tablet (325 mg total) by mouth daily with breakfast. 03/02/13   Margarita Mail, PA-C  ibuprofen (ADVIL,MOTRIN) 600 MG tablet Take 1 tablet (600 mg total) by mouth every 8 (eight) hours as needed for pain. 01/15/13   Adlih Moreno-Coll, MD  ibuprofen (ADVIL,MOTRIN) 800 MG tablet Take 1 tablet (800 mg total) by mouth 3 (three) times daily. 03/02/13   Abigail Harris, PA-C   BP 152/101 mmHg  Pulse 90  Temp(Src) 98.8 F (37.1 C) (Oral)  Resp 16  SpO2 100%  LMP 01/08/2015 Physical Exam  Constitutional: She is oriented to person, place, and time. She appears well-developed and well-nourished.  Neck: Normal range of motion. Neck supple.  Cardiovascular: Normal rate and regular rhythm.   Pulmonary/Chest: Effort normal.  Abdominal: Soft. Bowel sounds are normal. She exhibits mass. She exhibits no distension. There is tenderness in the suprapubic area. There is no rebound and no guarding.    Lymphadenopathy:    She has no cervical adenopathy.  Neurological: She is alert and oriented to person, place, and time.  Skin: Skin is warm and dry.  Nursing note and vitals reviewed.   ED Course  Procedures (including critical care time) Labs Review Labs Reviewed  POCT URINALYSIS DIP (DEVICE)  POCT PREGNANCY, URINE   U/a and upreg--neg. Imaging Review No results found.   MDM   1. Pelvic mass in female       Billy Fischer, MD 02/05/15 [place], MD 02/05/15 1430

## 2015-02-05 NOTE — ED Notes (Signed)
Pt. Remains NPO.

## 2015-02-05 NOTE — Discharge Instructions (Signed)
You were evaluated in the ED today for abdominal discomfort. There is not appear to be an emergent cause her symptoms at this time. It is important to follow up with your primary care or women's center for further evaluation and management of your symptoms. Return to ED for new or worsening symptoms.  Abdominal Pain, Women Abdominal (stomach, pelvic, or belly) pain can be caused by many things. It is important to tell your doctor:  The location of the pain.  Does it come and go or is it present all the time?  Are there things that start the pain (eating certain foods, exercise)?  Are there other symptoms associated with the pain (fever, nausea, vomiting, diarrhea)? All of this is helpful to know when trying to find the cause of the pain. CAUSES   Stomach: virus or bacteria infection, or ulcer.  Intestine: appendicitis (inflamed appendix), regional ileitis (Crohn's disease), ulcerative colitis (inflamed colon), irritable bowel syndrome, diverticulitis (inflamed diverticulum of the colon), or cancer of the stomach or intestine.  Gallbladder disease or stones in the gallbladder.  Kidney disease, kidney stones, or infection.  Pancreas infection or cancer.  Fibromyalgia (pain disorder).  Diseases of the female organs:  Uterus: fibroid (non-cancerous) tumors or infection.  Fallopian tubes: infection or tubal pregnancy.  Ovary: cysts or tumors.  Pelvic adhesions (scar tissue).  Endometriosis (uterus lining tissue growing in the pelvis and on the pelvic organs).  Pelvic congestion syndrome (female organs filling up with blood just before the menstrual period).  Pain with the menstrual period.  Pain with ovulation (producing an egg).  Pain with an IUD (intrauterine device, birth control) in the uterus.  Cancer of the female organs.  Functional pain (pain not caused by a disease, may improve without treatment).  Psychological pain.  Depression. DIAGNOSIS  Your doctor will  decide the seriousness of your pain by doing an examination.  Blood tests.  X-rays.  Ultrasound.  CT scan (computed tomography, special type of X-ray).  MRI (magnetic resonance imaging).  Cultures, for infection.  Barium enema (dye inserted in the large intestine, to better view it with X-rays).  Colonoscopy (looking in intestine with a lighted tube).  Laparoscopy (minor surgery, looking in abdomen with a lighted tube).  Major abdominal exploratory surgery (looking in abdomen with a large incision). TREATMENT  The treatment will depend on the cause of the pain.   Many cases can be observed and treated at home.  Over-the-counter medicines recommended by your caregiver.  Prescription medicine.  Antibiotics, for infection.  Birth control pills, for painful periods or for ovulation pain.  Hormone treatment, for endometriosis.  Nerve blocking injections.  Physical therapy.  Antidepressants.  Counseling with a psychologist or psychiatrist.  Minor or major surgery. HOME CARE INSTRUCTIONS   Do not take laxatives, unless directed by your caregiver.  Take over-the-counter pain medicine only if ordered by your caregiver. Do not take aspirin because it can cause an upset stomach or bleeding.  Try a clear liquid diet (broth or water) as ordered by your caregiver. Slowly move to a bland diet, as tolerated, if the pain is related to the stomach or intestine.  Have a thermometer and take your temperature several times a day, and record it.  Bed rest and sleep, if it helps the pain.  Avoid sexual intercourse, if it causes pain.  Avoid stressful situations.  Keep your follow-up appointments and tests, as your caregiver orders.  If the pain does not go away with medicine or surgery, you  may try:  Acupuncture.  Relaxation exercises (yoga, meditation).  Group therapy.  Counseling. SEEK MEDICAL CARE IF:   You notice certain foods cause stomach pain.  Your home  care treatment is not helping your pain.  You need stronger pain medicine.  You want your IUD removed.  You feel faint or lightheaded.  You develop nausea and vomiting.  You develop a rash.  You are having side effects or an allergy to your medicine. SEEK IMMEDIATE MEDICAL CARE IF:   Your pain does not go away or gets worse.  You have a fever.  Your pain is felt only in portions of the abdomen. The right side could possibly be appendicitis. The left lower portion of the abdomen could be colitis or diverticulitis.  You are passing blood in your stools (bright red or black tarry stools, with or without vomiting).  You have blood in your urine.  You develop chills, with or without a fever.  You pass out. MAKE SURE YOU:   Understand these instructions.  Will watch your condition.  Will get help right away if you are not doing well or get worse. Document Released: 06/04/2007 Document Revised: 12/22/2013 Document Reviewed: 06/24/2009 [facility] Patient Information 2015 [place], [place]. This information is not intended to replace advice given to you by your health care provider. Make sure you discuss any questions you have with your health care provider.

## 2015-02-05 NOTE — ED Notes (Signed)
Pt  Reports   Symptoms  Of  Abdominal  Pain   With          Swelling      As  Well  As   Some  Vaginal  Discharge    With  Onset  Of   Symptoms  X  3   Weeks       Pt  Reports  As   Well    Some  Back  Pain

## 2015-02-08 LAB — GC/CHLAMYDIA PROBE AMP (~~LOC~~) NOT AT ARMC
Chlamydia: NEGATIVE
NEISSERIA GONORRHEA: NEGATIVE

## 2015-02-17 ENCOUNTER — Ambulatory Visit (INDEPENDENT_AMBULATORY_CARE_PROVIDER_SITE_OTHER): Payer: Medicaid Other | Admitting: Family Medicine

## 2015-02-17 ENCOUNTER — Encounter: Payer: Self-pay | Admitting: Family Medicine

## 2015-02-17 ENCOUNTER — Encounter: Payer: Self-pay | Admitting: *Deleted

## 2015-02-17 ENCOUNTER — Other Ambulatory Visit (HOSPITAL_COMMUNITY)
Admission: RE | Admit: 2015-02-17 | Discharge: 2015-02-17 | Disposition: A | Discharge status: Home / Self Care | Payer: Medicaid Other | Source: Ambulatory Visit | Attending: Family Medicine | Admitting: Family Medicine

## 2015-02-17 VITALS — BP 129/84 | HR 102 | Wt 114.0 lb

## 2015-02-17 DIAGNOSIS — D259 Leiomyoma of uterus, unspecified: Secondary | ICD-10-CM | POA: Insufficient documentation

## 2015-02-17 DIAGNOSIS — D500 Iron deficiency anemia secondary to blood loss (chronic): Secondary | ICD-10-CM | POA: Diagnosis not present

## 2015-02-17 DIAGNOSIS — D62 Acute posthemorrhagic anemia: Secondary | ICD-10-CM | POA: Insufficient documentation

## 2015-02-17 DIAGNOSIS — Z124 Encounter for screening for malignant neoplasm of cervix: Secondary | ICD-10-CM | POA: Diagnosis not present

## 2015-02-17 DIAGNOSIS — N920 Excessive and frequent menstruation with regular cycle: Secondary | ICD-10-CM | POA: Diagnosis not present

## 2015-02-17 DIAGNOSIS — R634 Abnormal weight loss: Secondary | ICD-10-CM | POA: Insufficient documentation

## 2015-02-17 DIAGNOSIS — Z1151 Encounter for screening for human papillomavirus (HPV): Secondary | ICD-10-CM | POA: Insufficient documentation

## 2015-02-17 DIAGNOSIS — Z01419 Encounter for gynecological examination (general) (routine) without abnormal findings: Secondary | ICD-10-CM | POA: Diagnosis present

## 2015-02-17 NOTE — Progress Notes (Addendum)
Subjective:    Patient ID: Julie Roberson is a 39 y.o. female presenting with Fibroids  on 02/17/2015  HPI: New patient presents with fibroids.  Reports pain in abdomen and pressure. Notes a fibroid that moves, makes it difficult to sit or sleep on and has painful intercourse.  Also notes cycles last 9-12 days. Has anemia with this.  Fibroids are getting larger over the last year. Reports marked weight loss over the last few months without trying and hot flashes.  Review of Systems  Constitutional: Positive for fatigue and unexpected weight change (weight loss). Negative for fever and chills.  Respiratory: Negative for shortness of breath.   Cardiovascular: Negative for chest pain.  Gastrointestinal: Negative for nausea, vomiting and abdominal pain.  Endocrine: Positive for heat intolerance.  Genitourinary: Positive for vaginal bleeding and menstrual problem. Negative for dysuria.  Skin: Negative for rash.  Neurological: Positive for dizziness.      Objective:    BP 129/84 mmHg  Pulse 102  Wt 114 lb (51.71 kg)  LMP 02/08/2015 Physical Exam  Constitutional: She is oriented to person, place, and time. She appears well-developed and well-nourished. No distress.  HENT:  Head: Normocephalic and atraumatic.  Eyes: No scleral icterus.  Neck: Neck supple.  Cardiovascular: Normal rate.   Pulmonary/Chest: Effort normal.  Abdominal: Soft. She exhibits mass.  Genitourinary:  BUS normal, vagina is pink and rugated, cervix is multiparous without lesion, uterus is enlarged and has multiple fibroids, the largest comes off the right side and extends to just below the umbilicus, no adnexal mass or tenderness.   Neurological: She is alert and oriented to person, place, and time.  Skin: Skin is warm and dry.  Psychiatric: She has a normal mood and affect.   Procedure: Patient given informed consent, signed copy in the chart, time out was performed. Appropriate time out taken. . The patient  was placed in the lithotomy position and the cervix brought into view with sterile speculum.  Portio of cervix cleansed x 2 with betadine swabs.  A tenaculum was placed in the anterior lip of the cervix.  The uterus was sounded for depth of 12 cm. A pipelle was introduced to into the uterus, suction created,  and an endometrial sample was obtained. All equipment was removed and accounted for.  The patient tolerated the procedure well.    Patient given post procedure instructions. The patient will return in 2 weeks for results.       Assessment & Plan:   Problem List Items Addressed This Visit      Unprioritized   Fibroid uterus - Primary    Given size, will likely need hysterectomy.  Will await results to discuss options and techniques to safely get it out.      Relevant Orders   US Pelvis Complete   US Transvaginal Non-OB   Menorrhagia with regular cycle    Given how much bleeding and length of cycles and age, will check endometrial biopsy.      Relevant Orders   Surgical pathology   US Pelvis Complete   US Transvaginal Non-OB   Anemia   Weight loss    Given how dramatic this was, will check her thyroid.      Relevant Orders   TSH    Other Visit Diagnoses    Screening for cervical cancer        Relevant Orders    Cytology - PAP      Return in about 4 weeks (  around 03/17/2015) for a follow-up, discuss treatment options.    PRATT,TANYA S 02/17/2015 9:06 AM

## 2015-02-17 NOTE — Patient Instructions (Signed)
Hysterectomy Information  A hysterectomy is a surgery in which your uterus is removed. This surgery may be done to treat various medical problems. After the surgery, you will no longer have menstrual periods. The surgery will also make you unable to become pregnant (sterile). The fallopian tubes and ovaries can be removed (bilateral salpingo-oophorectomy) during this surgery as well.  REASONS FOR A HYSTERECTOMY  Persistent, abnormal bleeding.  Lasting (chronic) pelvic pain or infection.  The lining of the uterus (endometrium) starts growing outside the uterus (endometriosis).  The endometrium starts growing in the muscle of the uterus (adenomyosis).  The uterus falls down into the vagina (pelvic organ prolapse).  Noncancerous growths in the uterus (uterine fibroids) that cause symptoms.  Precancerous cells.  Cervical cancer or uterine cancer. TYPES OF HYSTERECTOMIES  Supracervical hysterectomy--In this type, the top part of the uterus is removed, but not the cervix.  Total hysterectomy--The uterus and cervix are removed.  Radical hysterectomy--The uterus, the cervix, and the fibrous tissue that holds the uterus in place in the pelvis (parametrium) are removed. WAYS A HYSTERECTOMY CAN BE PERFORMED  Abdominal hysterectomy--A large surgical cut (incision) is made in the abdomen. The uterus is removed through this incision.  Vaginal hysterectomy--An incision is made in the vagina. The uterus is removed through this incision. There are no abdominal incisions.  Conventional laparoscopic hysterectomy--Three or four small incisions are made in the abdomen. A thin, lighted tube with a camera (laparoscope) is inserted into one of the incisions. Other tools are put through the other incisions. The uterus is cut into small pieces. The small pieces are removed through the incisions, or they are removed through the vagina.  Laparoscopically assisted vaginal hysterectomy (LAVH)--Three or four  small incisions are made in the abdomen. Part of the surgery is performed laparoscopically and part vaginally. The uterus is removed through the vagina.  Robot-assisted laparoscopic hysterectomy--A laparoscope and other tools are inserted into 3 or 4 small incisions in the abdomen. A computer-controlled device is used to give the surgeon a 3D image and to help control the surgical instruments. This allows for more precise movements of surgical instruments. The uterus is cut into small pieces and removed through the incisions or removed through the vagina. RISKS AND COMPLICATIONS  Possible complications associated with this procedure include:  Bleeding and risk of blood transfusion. Tell your health care provider if you do not want to receive any blood products.  Blood clots in the legs or lung.  Infection.  Injury to surrounding organs.  Problems or side effects related to anesthesia.  Conversion to an abdominal hysterectomy from one of the other techniques. WHAT TO EXPECT AFTER A HYSTERECTOMY  You will be given pain medicine.  You will need to have someone with you for the first 3-5 days after you go home.  You will need to follow up with your surgeon in 2-4 weeks after surgery to evaluate your progress.  You may have early menopause symptoms such as hot flashes, night sweats, and insomnia.  If you had a hysterectomy for a problem that was not cancer or not a condition that could lead to cancer, then you no longer need Pap tests. However, even if you no longer need a Pap test, a regular exam is a good idea to make sure no other problems are starting. Document Released: 01/31/2001 Document Revised: 05/28/2013 Document Reviewed: 04/14/2013 [facility] Patient Information 2015 [place], [place]. This information is not intended to replace advice given to you by your health care  provider. Make sure you discuss any questions you have with your health care provider. Fibroids Fibroids are lumps  (tumors) that can occur any place in a woman's body. These lumps are not cancerous. Fibroids vary in size, weight, and where they grow. HOME CARE  Do not take aspirin.  Write down the number of pads or tampons you use during your period. Tell your doctor. This can help determine the best treatment for you. GET HELP RIGHT AWAY IF:  You have pain in your lower belly (abdomen) that is not helped with medicine.  You have cramps that are not helped with medicine.  You have more bleeding between or during your period.  You feel lightheaded or pass out (faint).  Your lower belly pain gets worse. MAKE SURE YOU:  Understand these instructions.  Will watch your condition.  Will get help right away if you are not doing well or get worse. Document Released: 09/09/2010 Document Revised: 10/30/2011 Document Reviewed: 09/09/2010 [facility] Patient Information 2015 [place], [place]. This information is not intended to replace advice given to you by your health care provider. Make sure you discuss any questions you have with your health care provider.

## 2015-02-17 NOTE — Assessment & Plan Note (Signed)
Given how much bleeding and length of cycles and age, will check endometrial biopsy.

## 2015-02-17 NOTE — Assessment & Plan Note (Signed)
Given how dramatic this was, will check her thyroid.

## 2015-02-17 NOTE — Assessment & Plan Note (Signed)
Given size, will likely need hysterectomy.  Will await results to discuss options and techniques to safely get it out.

## 2015-02-18 LAB — TSH: TSH: 1.54 u[IU]/mL (ref 0.350–4.500)

## 2015-02-18 LAB — CYTOLOGY - PAP

## 2015-02-19 ENCOUNTER — Telehealth: Payer: Self-pay | Admitting: *Deleted

## 2015-02-19 NOTE — Telephone Encounter (Signed)
Called pt and informed her that her thyroid function test was normal.  Pt voiced understanding.

## 2015-02-24 ENCOUNTER — Ambulatory Visit (HOSPITAL_COMMUNITY)
Admission: RE | Admit: 2015-02-24 | Discharge: 2015-02-24 | Disposition: A | Discharge status: Home / Self Care | Payer: Medicaid Other | Source: Ambulatory Visit | Attending: Family Medicine | Admitting: Family Medicine

## 2015-02-24 DIAGNOSIS — D259 Leiomyoma of uterus, unspecified: Secondary | ICD-10-CM | POA: Diagnosis not present

## 2015-02-24 DIAGNOSIS — N920 Excessive and frequent menstruation with regular cycle: Secondary | ICD-10-CM | POA: Diagnosis present

## 2015-03-17 ENCOUNTER — Encounter: Payer: Self-pay | Admitting: Family Medicine

## 2015-03-17 ENCOUNTER — Ambulatory Visit (INDEPENDENT_AMBULATORY_CARE_PROVIDER_SITE_OTHER): Payer: Medicaid Other | Admitting: Family Medicine

## 2015-03-17 VITALS — BP 124/82 | HR 86 | Resp 16 | Ht 62.5 in | Wt 113.0 lb

## 2015-03-17 DIAGNOSIS — D259 Leiomyoma of uterus, unspecified: Secondary | ICD-10-CM | POA: Diagnosis not present

## 2015-03-17 DIAGNOSIS — D500 Iron deficiency anemia secondary to blood loss (chronic): Secondary | ICD-10-CM | POA: Diagnosis not present

## 2015-03-17 DIAGNOSIS — N920 Excessive and frequent menstruation with regular cycle: Secondary | ICD-10-CM | POA: Diagnosis not present

## 2015-03-17 MED ORDER — MEGESTROL ACETATE 40 MG PO TABS
40.0000 mg | ORAL_TABLET | Freq: Two times a day (BID) | ORAL | Status: DC
Start: 2015-03-17 — End: 2015-03-18

## 2015-03-17 MED ORDER — MULTIGEN 70 MG PO TABS
1.0000 | ORAL_TABLET | Freq: Two times a day (BID) | ORAL | Status: DC
Start: 2015-03-17 — End: 2018-01-18

## 2015-03-17 NOTE — Patient Instructions (Signed)
Abdominal Hysterectomy Abdominal hysterectomy is a surgical procedure to remove your womb (uterus). Your uterus is the muscular organ that contains a developing baby. This surgery is done for many reasons. You may need an abdominal hysterectomy if you have cancer, growths (tumors), long-term pain, or bleeding. You may also have this procedure if your uterus has slipped down into your vagina (uterine prolapse). Depending on why you need an abdominal hysterectomy, you may also have other reproductive organs removed. These could include the part of your vagina that connects with your uterus (cervix), the organs that make eggs (ovaries), and the tubes that connect the ovaries to the uterus (fallopian tubes). LET [facility] CARE PROVIDER KNOW ABOUT:   Any allergies you have.  All medicines you are taking, including vitamins, herbs, eye drops, creams, and over-the-counter medicines.  Previous problems you or members of your family have had with the use of anesthetics.  Any blood disorders you have.  Previous surgeries you have had.  Medical conditions you have. RISKS AND COMPLICATIONS Generally, this is a safe procedure. However, as with any procedure, problems can occur. Infection is the most common problem after an abdominal hysterectomy. Other possible problems include:  Bleeding.  Formation of blood clots that may break free and travel to your lungs.  Injury to other organs near your uterus.  Nerve injury causing nerve pain.  Decreased interest in sex or pain during sexual intercourse. BEFORE THE PROCEDURE  Abdominal hysterectomy is a major surgical procedure. It can affect the way you feel about yourself. Talk to your health care provider about the physical and emotional changes hysterectomy may cause.  You may need to have blood work and X-rays done before surgery.  Quit smoking if you smoke. Ask your health care provider for help if you are struggling to quit.  Stop taking  medicines that thin your blood as directed by your health care provider.  You may be instructed to take antibiotic medicines or laxatives before surgery.  Do not eat or drink anything for 6-8 hours before surgery.  Take your regular medicines with a small sip of water.  Bathe or shower the night or morning before surgery. PROCEDURE  Abdominal hysterectomy is done in the operating room at the hospital.  In most cases, you will be given a medicine that makes you go to sleep (general anesthetic).  The surgeon will make a cut (incision) through the skin in your lower belly.  The incision may be about 5-7 inches long. It may go side-to-side or up-and-down.  The surgeon will move aside the body tissue that covers your uterus. The surgeon will then carefully take out your uterus along with any of your other reproductive organs that need to be removed.  Bleeding will be controlled with clamps or sutures.  The surgeon will close your incision with sutures or metal clips. AFTER THE PROCEDURE  You will have some pain immediately after the procedure.  You will be given pain medicine in the recovery room.  You will be taken to your hospital room when you have recovered from the anesthesia.  You may need to stay in the hospital for 2-5 days.  You will be given instructions for recovery at home. Document Released: 08/12/2013 Document Reviewed: 08/12/2013 [facility] Patient Information 2015 [place], [place]. This information is not intended to replace advice given to you by your health care provider. Make sure you discuss any questions you have with your health care provider.

## 2015-03-17 NOTE — Assessment & Plan Note (Signed)
Given size--will proceed with TAH. Risks include but are not limited to bleeding, infection, injury to surrounding structures, including bowel, bladder and ureters, blood clots, and death.  Likelihood of success is high.

## 2015-03-17 NOTE — Assessment & Plan Note (Signed)
Megace to help with decreasing cycle and anemia

## 2015-03-17 NOTE — Progress Notes (Signed)
    Subjective:    Patient ID: Julie Roberson is a 39 y.o. female presenting with Follow-up  on 03/17/2015  HPI: Here today for f/u. Has fibroid uterus which is 18wk size. Normal EMB and pap smear.  Review of Systems  Constitutional: Negative for fever and chills.  Respiratory: Negative for shortness of breath.   Cardiovascular: Negative for chest pain.  Gastrointestinal: Negative for nausea, vomiting and abdominal pain.  Genitourinary: Negative for dysuria.  Skin: Negative for rash.      Objective:    BP 124/82 mmHg  Pulse 86  Resp 16  Ht 5' 2.5" (1.588 m)  Wt 113 lb (51.256 kg)  BMI 20.33 kg/m2  LMP 03/07/2015 Physical Exam  Constitutional: She is oriented to person, place, and time. She appears well-developed and well-nourished. No distress.  HENT:  Head: Normocephalic and atraumatic.  Eyes: No scleral icterus.  Neck: Neck supple.  Cardiovascular: Normal rate.   Pulmonary/Chest: Effort normal.  Abdominal: Soft. She exhibits mass (up to umbilicus).  Genitourinary:     Neurological: She is alert and oriented to person, place, and time.  Skin: Skin is warm and dry.  Psychiatric: She has a normal mood and affect.        Assessment & Plan:    Return in about 3 months (around 06/17/2015) for postop check.  PRATT,TANYA S 03/17/2015 9:54 AM

## 2015-03-17 NOTE — Assessment & Plan Note (Signed)
Begin Iron supplementation

## 2015-03-18 ENCOUNTER — Encounter (HOSPITAL_COMMUNITY): Payer: Self-pay | Admitting: *Deleted

## 2015-03-18 ENCOUNTER — Telehealth: Payer: Self-pay

## 2015-03-18 MED ORDER — MEGESTROL ACETATE 20 MG PO TABS: 40.0000 mg | ORAL_TABLET | Freq: Two times a day (BID) | ORAL | Status: DC

## 2015-03-18 NOTE — Telephone Encounter (Signed)
Pt called and stated that Dr. Kennon Rounds prescribed Megace 40 mg tablet and Medicaid does not cover it.  Could she have another prescription that she can afford? Called pt and informed pt that we changed to the tablet from 40 mg to 20 mg tablet.  She will need take two 20 mg tablets twice a day to equal 40 mg dosage per tablet as prior prescription.  Medicaid should cover the 20 mg tablet.  Pt stated "thank you".

## 2015-05-06 ENCOUNTER — Encounter (HOSPITAL_COMMUNITY): Payer: Self-pay

## 2015-05-06 ENCOUNTER — Encounter (HOSPITAL_COMMUNITY)
Admission: RE | Admit: 2015-05-06 | Discharge: 2015-05-06 | Disposition: A | Discharge status: Home / Self Care | Payer: Medicaid Other | Source: Ambulatory Visit | Attending: Family Medicine | Admitting: Family Medicine

## 2015-05-06 DIAGNOSIS — Z01818 Encounter for other preprocedural examination: Secondary | ICD-10-CM | POA: Diagnosis not present

## 2015-05-06 DIAGNOSIS — D259 Leiomyoma of uterus, unspecified: Secondary | ICD-10-CM | POA: Diagnosis not present

## 2015-05-06 LAB — CBC
HEMATOCRIT: 28.5 % — AB (ref 36.0–46.0)
Hemoglobin: 8.3 g/dL — ABNORMAL LOW (ref 12.0–15.0)
MCH: 20.1 pg — ABNORMAL LOW (ref 26.0–34.0)
MCHC: 29.1 g/dL — ABNORMAL LOW (ref 30.0–36.0)
MCV: 69 fL — ABNORMAL LOW (ref 78.0–100.0)
Platelets: 384 10*3/uL (ref 150–400)
RBC: 4.13 MIL/uL (ref 3.87–5.11)
RDW: 23.8 % — AB (ref 11.5–15.5)
WBC: 9.4 10*3/uL (ref 4.0–10.5)

## 2015-05-06 LAB — BASIC METABOLIC PANEL
Anion gap: 7 (ref 5–15)
BUN: 11 mg/dL (ref 6–20)
CALCIUM: 9.5 mg/dL (ref 8.9–10.3)
CO2: 21 mmol/L — AB (ref 22–32)
Chloride: 105 mmol/L (ref 101–111)
Creatinine, Ser: 0.54 mg/dL (ref 0.44–1.00)
GFR calc Af Amer: >60 mL/min (ref >60)
GFR calc non Af Amer: >60 mL/min (ref >60)
GLUCOSE: 97 mg/dL (ref 65–99)
POTASSIUM: 3.6 mmol/L (ref 3.5–5.1)
Sodium: 133 mmol/L — ABNORMAL LOW (ref 135–145)

## 2015-05-06 NOTE — H&P (Signed)
  Julie Roberson is an 39 y.o. [id number] Unknown female.   Chief Complaint: fibroid uterus HPI: Has fibroid uterus which is 18wk size. Normal EMB and pap smear. Pelvic pain and bleeding and desires definitive treatment.  Past Medical History  Diagnosis Date  . Hypertension     Past Surgical History  Procedure Laterality Date  . Cesarean section      No family history on file.  Social History:  reports that she has been smoking Cigarettes.  She has been smoking about 0.25 packs per day. She has never used smokeless tobacco. She reports that she drinks about 3.5 oz of alcohol per week. She reports that she does not use illicit drugs.  Allergies: No Known Allergies  No current facility-administered medications on file prior to encounter.   Current Outpatient Prescriptions on File Prior to Encounter  Medication Sig Dispense Refill  . lisinopril-hydrochlorothiazide (PRINZIDE,ZESTORETIC) 10-12.5 MG per tablet Take 1 tablet by mouth daily.  1  . mineral/vitamin supplement (MULTIGEN) 70 MG TABS tablet Take 1 tablet (70 mg total) by mouth 2 (two) times daily. (Patient not taking: Reported on 05/04/2015) 60 each 3    A comprehensive review of systems was negative.  There were no vitals taken for this visit. General appearance: alert, cooperative and appears stated age Head: Normocephalic, without obvious abnormality, atraumatic Neck: supple, symmetrical, trachea midline Lungs: normal effort Heart: regular rate and rhythm Abdomen: 18 wk size firm mass in lower abdomen Extremities: extremities normal, atraumatic, no cyanosis or edema Skin: Skin color, texture, turgor normal. No rashes or lesions Neurologic: Grossly normal   Lab Results  Component Value Date   WBC 9.4 05/06/2015   HGB 8.3* 05/06/2015   HCT 28.5* 05/06/2015   MCV 69.0* 05/06/2015   PLT 384 05/06/2015   Lab Results  Component Value Date   PREGTESTUR NEGATIVE 02/05/2015     Assessment/Plan Patient Active  Problem List   Diagnosis Date Noted  . Fibroid uterus 02/17/2015  . Menorrhagia with regular cycle 02/17/2015  . Anemia 02/17/2015  . Weight loss 02/17/2015   For TAH given size of uterus. Risks include but are not limited to bleeding, infection, injury to surrounding structures, including bowel, bladder and ureters, blood clots, and death.  Likelihood of success is high.   Shevaun Lovan S 05/06/2015, 4:45 PM

## 2015-05-06 NOTE — Patient Instructions (Signed)
Your procedure is scheduled on:  May 18, 2015  Enter through the Main Entrance of [facility] at:  7:30 am   Pick up the phone at the desk and dial [phone].  Call this number if you have problems the morning of surgery: [phone].  Remember: Do NOT eat food: after midnight on Monday night  Do NOT drink clear liquids after: midnight on Monday night  Take these medicines the morning of surgery with a SIP OF WATER:  Prinzide   Do NOT wear jewelry (body piercing), metal hair clips/bobby pins, make-up, or nail polish. Do NOT wear lotions, powders, or perfumes.  You may wear deoderant. Do NOT shave for 48 hours prior to surgery. Do NOT bring valuables to the hospital. Contacts, dentures, or bridgework may not be worn into surgery. Leave suitcase in car.  After surgery it may be brought to your room.  For patients admitted to the hospital, checkout time is 11:00 AM the day of discharge.

## 2015-05-18 ENCOUNTER — Inpatient Hospital Stay (HOSPITAL_COMMUNITY): Payer: Medicaid Other | Admitting: Anesthesiology

## 2015-05-18 ENCOUNTER — Inpatient Hospital Stay (HOSPITAL_COMMUNITY)
Admission: RE | Admit: 2015-05-18 | Discharge: 2015-05-20 | DRG: 742 | Disposition: A | Discharge status: Home / Self Care | Payer: Medicaid Other | Source: Ambulatory Visit | Attending: Family Medicine | Admitting: Family Medicine

## 2015-05-18 ENCOUNTER — Encounter (HOSPITAL_COMMUNITY)
Admission: RE | Disposition: A | Discharge status: Home / Self Care | Payer: Self-pay | Source: Ambulatory Visit | Attending: Family Medicine

## 2015-05-18 ENCOUNTER — Encounter (HOSPITAL_COMMUNITY): Payer: Self-pay | Admitting: Family Medicine

## 2015-05-18 DIAGNOSIS — I10 Essential (primary) hypertension: Secondary | ICD-10-CM | POA: Diagnosis present

## 2015-05-18 DIAGNOSIS — D62 Acute posthemorrhagic anemia: Secondary | ICD-10-CM | POA: Diagnosis not present

## 2015-05-18 DIAGNOSIS — Z90710 Acquired absence of both cervix and uterus: Secondary | ICD-10-CM | POA: Diagnosis present

## 2015-05-18 DIAGNOSIS — R102 Pelvic and perineal pain: Secondary | ICD-10-CM | POA: Diagnosis present

## 2015-05-18 DIAGNOSIS — N920 Excessive and frequent menstruation with regular cycle: Secondary | ICD-10-CM | POA: Diagnosis present

## 2015-05-18 DIAGNOSIS — D259 Leiomyoma of uterus, unspecified: Secondary | ICD-10-CM | POA: Diagnosis present

## 2015-05-18 DIAGNOSIS — F17210 Nicotine dependence, cigarettes, uncomplicated: Secondary | ICD-10-CM | POA: Diagnosis present

## 2015-05-18 HISTORY — PX: ABDOMINAL HYSTERECTOMY: SHX81

## 2015-05-18 LAB — ABO/RH: ABO/RH(D): A POS

## 2015-05-18 LAB — PREPARE RBC (CROSSMATCH)

## 2015-05-18 LAB — PREGNANCY, URINE: Preg Test, Ur: NEGATIVE

## 2015-05-18 SURGERY — HYSTERECTOMY, ABDOMINAL
Anesthesia: General | Site: Abdomen | Laterality: Bilateral

## 2015-05-18 SURGERY — HYSTERECTOMY, ABDOMINAL
Anesthesia: Choice | Site: Abdomen

## 2015-05-18 MED ORDER — HYDROMORPHONE HCL 1 MG/ML IJ SOLN
INTRAMUSCULAR | Status: AC
  Filled 2015-05-18: qty 1

## 2015-05-18 MED ORDER — LIDOCAINE HCL (CARDIAC) 20 MG/ML IV SOLN
INTRAVENOUS | Status: DC | PRN
  Administered 2015-05-18: 60 mg via INTRAVENOUS

## 2015-05-18 MED ORDER — OXYCODONE-ACETAMINOPHEN 5-325 MG PO TABS
1.0000 | ORAL_TABLET | ORAL | Status: DC | PRN
  Administered 2015-05-19 (×2): 2 via ORAL
  Administered 2015-05-20: 1 via ORAL
  Filled 2015-05-18: qty 2
  Filled 2015-05-18: qty 1
  Filled 2015-05-18: qty 2

## 2015-05-18 MED ORDER — KETOROLAC TROMETHAMINE 30 MG/ML IJ SOLN: 30.0000 mg | Freq: Four times a day (QID) | INTRAMUSCULAR | Status: DC

## 2015-05-18 MED ORDER — MEPERIDINE HCL 25 MG/ML IJ SOLN
INTRAMUSCULAR | Status: AC
  Filled 2015-05-18: qty 1

## 2015-05-18 MED ORDER — MIDAZOLAM HCL 2 MG/2ML IJ SOLN
INTRAMUSCULAR | Status: AC
  Filled 2015-05-18: qty 4

## 2015-05-18 MED ORDER — HYDROMORPHONE 0.3 MG/ML IV SOLN
INTRAVENOUS | Status: DC
  Administered 2015-05-18: 4.2 mg via INTRAVENOUS
  Administered 2015-05-18: 19 mg via INTRAVENOUS
  Administered 2015-05-18: 1.2 mg via INTRAVENOUS
  Administered 2015-05-18 – 2015-05-19 (×2): via INTRAVENOUS
  Administered 2015-05-19: 1.14 mg via INTRAVENOUS
  Administered 2015-05-19: 8.83 mg via INTRAVENOUS
  Filled 2015-05-18 (×2): qty 25

## 2015-05-18 MED ORDER — ONDANSETRON HCL 4 MG/2ML IJ SOLN: 4.0000 mg | Freq: Four times a day (QID) | INTRAMUSCULAR | Status: DC | PRN

## 2015-05-18 MED ORDER — MENTHOL 3 MG MT LOZG: 1.0000 | LOZENGE | OROMUCOSAL | Status: DC | PRN

## 2015-05-18 MED ORDER — HYDROMORPHONE HCL 1 MG/ML IJ SOLN
INTRAMUSCULAR | Status: AC
  Administered 2015-05-18: 0.5 mg via INTRAVENOUS
  Filled 2015-05-18: qty 1

## 2015-05-18 MED ORDER — MEPERIDINE HCL 25 MG/ML IJ SOLN
6.2500 mg | INTRAMUSCULAR | Status: DC | PRN
  Administered 2015-05-18: 6.25 mg via INTRAVENOUS

## 2015-05-18 MED ORDER — BUPIVACAINE HCL (PF) 0.25 % IJ SOLN
INTRAMUSCULAR | Status: AC
  Filled 2015-05-18: qty 30

## 2015-05-18 MED ORDER — PROPOFOL 10 MG/ML IV BOLUS
INTRAVENOUS | Status: AC
  Filled 2015-05-18: qty 20

## 2015-05-18 MED ORDER — CEFAZOLIN SODIUM-DEXTROSE 2-3 GM-% IV SOLR
2.0000 g | INTRAVENOUS | Status: AC
  Administered 2015-05-18: 2 g via INTRAVENOUS

## 2015-05-18 MED ORDER — NEOSTIGMINE METHYLSULFATE 10 MG/10ML IV SOLN
INTRAVENOUS | Status: AC
  Filled 2015-05-18: qty 1

## 2015-05-18 MED ORDER — DEXTROSE IN LACTATED RINGERS 5 % IV SOLN
INTRAVENOUS | Status: DC
  Administered 2015-05-18 – 2015-05-19 (×3): via INTRAVENOUS

## 2015-05-18 MED ORDER — SCOPOLAMINE 1 MG/3DAYS TD PT72
1.0000 | MEDICATED_PATCH | Freq: Once | TRANSDERMAL | Status: DC
  Administered 2015-05-18: 1.5 mg via TRANSDERMAL

## 2015-05-18 MED ORDER — DIPHENHYDRAMINE HCL 50 MG/ML IJ SOLN: 12.5000 mg | Freq: Four times a day (QID) | INTRAMUSCULAR | Status: DC | PRN

## 2015-05-18 MED ORDER — HYDROMORPHONE HCL 1 MG/ML IJ SOLN
0.2500 mg | INTRAMUSCULAR | Status: DC | PRN
Start: 2015-05-18 — End: 2015-05-18
  Administered 2015-05-18 (×3): 0.5 mg via INTRAVENOUS

## 2015-05-18 MED ORDER — FENTANYL CITRATE (PF) 100 MCG/2ML IJ SOLN
INTRAMUSCULAR | Status: DC | PRN
  Administered 2015-05-18: 100 ug via INTRAVENOUS
  Administered 2015-05-18 (×3): 50 ug via INTRAVENOUS

## 2015-05-18 MED ORDER — HYDROCHLOROTHIAZIDE 12.5 MG PO CAPS
12.5000 mg | ORAL_CAPSULE | Freq: Every day | ORAL | Status: DC
  Administered 2015-05-19 – 2015-05-20 (×2): 12.5 mg via ORAL
  Filled 2015-05-18 (×3): qty 1

## 2015-05-18 MED ORDER — ONDANSETRON HCL 4 MG/2ML IJ SOLN
INTRAMUSCULAR | Status: AC
  Filled 2015-05-18: qty 2

## 2015-05-18 MED ORDER — MIDAZOLAM HCL 5 MG/5ML IJ SOLN
INTRAMUSCULAR | Status: DC | PRN
  Administered 2015-05-18 (×2): 1 mg via INTRAVENOUS

## 2015-05-18 MED ORDER — HYDROMORPHONE HCL 1 MG/ML IJ SOLN
INTRAMUSCULAR | Status: DC | PRN
  Administered 2015-05-18 (×2): 0.5 mg via INTRAVENOUS

## 2015-05-18 MED ORDER — ONDANSETRON HCL 4 MG/2ML IJ SOLN
INTRAMUSCULAR | Status: DC | PRN
  Administered 2015-05-18: 4 mg via INTRAVENOUS

## 2015-05-18 MED ORDER — BUPIVACAINE HCL (PF) 0.25 % IJ SOLN
INTRAMUSCULAR | Status: DC | PRN
  Administered 2015-05-18: 20 mL

## 2015-05-18 MED ORDER — LISINOPRIL 10 MG PO TABS
10.0000 mg | ORAL_TABLET | Freq: Every day | ORAL | Status: DC
  Administered 2015-05-19 – 2015-05-20 (×2): 10 mg via ORAL
  Filled 2015-05-18 (×3): qty 1

## 2015-05-18 MED ORDER — HYDROMORPHONE HCL 1 MG/ML IJ SOLN
0.5000 mg | Freq: Once | INTRAMUSCULAR | Status: AC
  Administered 2015-05-18: 0.5 mg via INTRAVENOUS

## 2015-05-18 MED ORDER — PROPOFOL 10 MG/ML IV BOLUS
INTRAVENOUS | Status: DC | PRN
  Administered 2015-05-18: 120 mg via INTRAVENOUS
  Administered 2015-05-18: 30 mg via INTRAVENOUS

## 2015-05-18 MED ORDER — SCOPOLAMINE 1 MG/3DAYS TD PT72
MEDICATED_PATCH | TRANSDERMAL | Status: AC
  Administered 2015-05-18: 1.5 mg via TRANSDERMAL
  Filled 2015-05-18: qty 1

## 2015-05-18 MED ORDER — PROMETHAZINE HCL 25 MG/ML IJ SOLN: 6.2500 mg | INTRAMUSCULAR | Status: DC | PRN

## 2015-05-18 MED ORDER — GLYCOPYRROLATE 0.2 MG/ML IJ SOLN
INTRAMUSCULAR | Status: DC | PRN
  Administered 2015-05-18: 0.4 mg via INTRAVENOUS
  Administered 2015-05-18: 0.1 mg via INTRAVENOUS
  Administered 2015-05-18: 0.4 mg via INTRAVENOUS
  Administered 2015-05-18: 0.1 mg via INTRAVENOUS

## 2015-05-18 MED ORDER — DIPHENHYDRAMINE HCL 12.5 MG/5ML PO ELIX: 12.5000 mg | ORAL_SOLUTION | Freq: Four times a day (QID) | ORAL | Status: DC | PRN

## 2015-05-18 MED ORDER — LACTATED RINGERS IV SOLN
INTRAVENOUS | Status: DC
  Administered 2015-05-18 (×2): via INTRAVENOUS

## 2015-05-18 MED ORDER — LIDOCAINE HCL (CARDIAC) 20 MG/ML IV SOLN
INTRAVENOUS | Status: AC
  Filled 2015-05-18: qty 5

## 2015-05-18 MED ORDER — ROCURONIUM BROMIDE 100 MG/10ML IV SOLN
INTRAVENOUS | Status: DC | PRN
  Administered 2015-05-18: 10 mg via INTRAVENOUS
  Administered 2015-05-18: 40 mg via INTRAVENOUS

## 2015-05-18 MED ORDER — FENTANYL CITRATE (PF) 250 MCG/5ML IJ SOLN
INTRAMUSCULAR | Status: AC
  Filled 2015-05-18: qty 25

## 2015-05-18 MED ORDER — OXYCODONE-ACETAMINOPHEN 5-325 MG PO TABS: 1.0000 | ORAL_TABLET | Freq: Four times a day (QID) | ORAL | Status: DC | PRN

## 2015-05-18 MED ORDER — KETOROLAC TROMETHAMINE 30 MG/ML IJ SOLN
30.0000 mg | Freq: Four times a day (QID) | INTRAMUSCULAR | Status: DC
  Administered 2015-05-18 – 2015-05-19 (×4): 30 mg via INTRAVENOUS
  Filled 2015-05-18 (×4): qty 1

## 2015-05-18 MED ORDER — SODIUM CHLORIDE 0.9 % IJ SOLN: 9.0000 mL | INTRAMUSCULAR | Status: DC | PRN

## 2015-05-18 MED ORDER — KETOROLAC TROMETHAMINE 30 MG/ML IJ SOLN: 30.0000 mg | Freq: Once | INTRAMUSCULAR | Status: DC

## 2015-05-18 MED ORDER — CEFAZOLIN SODIUM-DEXTROSE 2-3 GM-% IV SOLR
INTRAVENOUS | Status: AC
  Filled 2015-05-18: qty 50

## 2015-05-18 MED ORDER — IBUPROFEN 600 MG PO TABS: 600.0000 mg | ORAL_TABLET | Freq: Four times a day (QID) | ORAL | Status: DC | PRN

## 2015-05-18 MED ORDER — DEXAMETHASONE SODIUM PHOSPHATE 4 MG/ML IJ SOLN
INTRAMUSCULAR | Status: AC
  Filled 2015-05-18: qty 1

## 2015-05-18 MED ORDER — LACTATED RINGERS IV SOLN
INTRAVENOUS | Status: DC
  Administered 2015-05-18: 08:00:00 via INTRAVENOUS

## 2015-05-18 MED ORDER — NEOSTIGMINE METHYLSULFATE 10 MG/10ML IV SOLN
INTRAVENOUS | Status: DC | PRN
  Administered 2015-05-18 (×2): 2 mg via INTRAVENOUS

## 2015-05-18 MED ORDER — KETOROLAC TROMETHAMINE 30 MG/ML IJ SOLN: 30.0000 mg | Freq: Once | INTRAMUSCULAR | Status: DC | PRN

## 2015-05-18 MED ORDER — ROCURONIUM BROMIDE 100 MG/10ML IV SOLN
INTRAVENOUS | Status: AC
  Filled 2015-05-18: qty 1

## 2015-05-18 MED ORDER — NALOXONE HCL 0.4 MG/ML IJ SOLN
0.4000 mg | INTRAMUSCULAR | Status: DC | PRN
Start: 2015-05-18 — End: 2015-05-19

## 2015-05-18 MED ORDER — LISINOPRIL-HYDROCHLOROTHIAZIDE 10-12.5 MG PO TABS: 1.0000 | ORAL_TABLET | Freq: Every day | ORAL | Status: DC

## 2015-05-18 MED ORDER — PROPOFOL 500 MG/50ML IV EMUL: INTRAVENOUS | Status: DC | PRN

## 2015-05-18 MED ORDER — GLYCOPYRROLATE 0.2 MG/ML IJ SOLN
INTRAMUSCULAR | Status: AC
  Filled 2015-05-18: qty 1

## 2015-05-18 SURGICAL SUPPLY — 45 items
APL SKNCLS STERI-STRIP NONHPOA (GAUZE/BANDAGES/DRESSINGS)
BARRIER ADHS 3X4 INTERCEED (GAUZE/BANDAGES/DRESSINGS) IMPLANT
BENZOIN TINCTURE PRP APPL 2/3 (GAUZE/BANDAGES/DRESSINGS) IMPLANT
BRR ADH 4X3 ABS CNTRL BYND (GAUZE/BANDAGES/DRESSINGS)
CANISTER SUCT 3000ML (MISCELLANEOUS) ×3 IMPLANT
CELLS DAT CNTRL 66122 CELL SVR (MISCELLANEOUS) IMPLANT
CLOSURE WOUND 1/2 X4 (GAUZE/BANDAGES/DRESSINGS)
CLOTH BEACON ORANGE TIMEOUT ST (SAFETY) ×3 IMPLANT
CONT PATH 16OZ SNAP LID 3702 (MISCELLANEOUS) ×3 IMPLANT
DECANTER SPIKE VIAL GLASS SM (MISCELLANEOUS) IMPLANT
DRAPE WARM FLUID 44X44 (DRAPE) ×2 IMPLANT
DRSG OPSITE POSTOP 4X10 (GAUZE/BANDAGES/DRESSINGS) ×3 IMPLANT
DURAPREP 26ML APPLICATOR (WOUND CARE) ×3 IMPLANT
GAUZE SPONGE 4X4 16PLY XRAY LF (GAUZE/BANDAGES/DRESSINGS) ×3 IMPLANT
GLOVE BIOGEL PI IND STRL 7.0 (GLOVE) ×3 IMPLANT
GLOVE BIOGEL PI INDICATOR 7.0 (GLOVE) ×6
GLOVE ECLIPSE 7.0 STRL STRAW (GLOVE) ×6 IMPLANT
GOWN STRL REUS W/TWL LRG LVL3 (GOWN DISPOSABLE) ×9 IMPLANT
NEEDLE HYPO 22GX1.5 SAFETY (NEEDLE) ×3 IMPLANT
NS IRRIG 1000ML POUR BTL (IV SOLUTION) ×3 IMPLANT
PACK ABDOMINAL GYN (CUSTOM PROCEDURE TRAY) ×3 IMPLANT
PAD OB MATERNITY 4.3X12.25 (PERSONAL CARE ITEMS) ×3 IMPLANT
PENCIL SMOKE EVAC W/HOLSTER (ELECTROSURGICAL) ×3 IMPLANT
PROTECTOR NERVE ULNAR (MISCELLANEOUS) ×3 IMPLANT
RETRACTOR WND ALEXIS 18 MED (MISCELLANEOUS) IMPLANT
RETRACTOR WND ALEXIS 25 LRG (MISCELLANEOUS) IMPLANT
RTRCTR WOUND ALEXIS 18CM MED (MISCELLANEOUS)
RTRCTR WOUND ALEXIS 25CM LRG (MISCELLANEOUS)
SPONGE LAP 18X18 X RAY DECT (DISPOSABLE) ×6 IMPLANT
STAPLER VISISTAT 35W (STAPLE) IMPLANT
STRIP CLOSURE SKIN 1/2X4 (GAUZE/BANDAGES/DRESSINGS) IMPLANT
SUT VIC AB 0 CT1 18XCR BRD8 (SUTURE) ×3 IMPLANT
SUT VIC AB 0 CT1 27 (SUTURE) ×12
SUT VIC AB 0 CT1 27XBRD ANBCTR (SUTURE) ×4 IMPLANT
SUT VIC AB 0 CT1 8-18 (SUTURE) ×9
SUT VIC AB 1 CTX 36 (SUTURE) ×6
SUT VIC AB 1 CTX36XBRD ANBCTRL (SUTURE) ×2 IMPLANT
SUT VIC AB 3-0 SH 27 (SUTURE)
SUT VIC AB 3-0 SH 27X BRD (SUTURE) IMPLANT
SUT VIC AB 4-0 KS 27 (SUTURE) ×2 IMPLANT
SUT VICRYL 0 TIES 12 18 (SUTURE) ×3 IMPLANT
SYR CONTROL 10ML LL (SYRINGE) ×3 IMPLANT
TOWEL OR 17X24 6PK STRL BLUE (TOWEL DISPOSABLE) ×6 IMPLANT
TRAY FOLEY CATH SILVER 14FR (SET/KITS/TRAYS/PACK) ×3 IMPLANT
WATER STERILE IRR 1000ML POUR (IV SOLUTION) ×3 IMPLANT

## 2015-05-18 NOTE — Discharge Instructions (Signed)
Abdominal Hysterectomy, Care After Refer to this sheet in the next few weeks. These instructions provide you with information on caring for yourself after your procedure. Your health care provider may also give you more specific instructions. Your treatment has been planned according to current medical practices, but problems sometimes occur. Call your health care provider if you have any problems or questions after your procedure.  WHAT TO EXPECT AFTER THE PROCEDURE After your procedure, it is typical to have the following:  Pain.  Feeling tired.  Poor appetite.  Less interest in sex. HOME CARE INSTRUCTIONS  It takes 4-6 weeks to recover from this surgery. Make sure you follow all your health care provider's instructions. Home care instructions may include:  Take pain medicines only as directed by your health care provider. Do not take over-the-counter pain medicines without checking with your health care provider first.  Change your bandage as directed by your health care provider.  Return to your health care provider to have your sutures taken out.  Take showers instead of baths for 2-3 weeks. Ask your health care provider when it is safe to start showering.  Do not douche, use tampons, or have sexual intercourse for at least 6 weeks or until your health care provider says you can.   Follow your health care provider's advice about exercise, lifting, driving, and general activities.  Get plenty of rest and sleep.   Do not lift anything heavier than a gallon of milk (about 10 lb [4.5 kg]) for the first month after surgery.  You can resume your normal diet if your health care provider says it is okay.   Do not drink alcohol until your health care provider says you can.   If you are constipated, ask your health care provider if you can take a mild laxative.  Eating foods high in fiber may also help with constipation. Eat plenty of raw fruits and vegetables, whole grains, and  beans.  Drink enough fluids to keep your urine clear or pale yellow.   Try to have someone at home with you for the first 1-2 weeks to help around the house.  Keep all follow-up appointments. SEEK MEDICAL CARE IF:   You have chills or fever.  You have swelling, redness, or pain in the area of your incision that is getting worse.   You have pus coming from the incision.   You notice a bad smell coming from the incision or bandage.   Your incision breaks open.   You feel dizzy or light-headed.   You have pain or bleeding when you urinate.   You have persistent diarrhea.   You have persistent nausea and vomiting.   You have abnormal vaginal discharge.   You have a rash.   You have any type of abnormal reaction or develop an allergy to your medicine.   Your pain medicine is not helping.  SEEK IMMEDIATE MEDICAL CARE IF:   You have a fever and your symptoms suddenly get worse.  You have severe abdominal pain.  You have chest pain.  You have shortness of breath.  You faint.  You have pain, swelling, or redness of your leg.  You have heavy vaginal bleeding with blood clots. MAKE SURE YOU:  Understand these instructions.  Will watch your condition.  Will get help right away if you are not doing well or get worse. Document Released: 02/24/2005 Document Revised: 08/12/2013 Document Reviewed: 05/30/2013 ExitCare Patient Information 2015 [employer]. This information is not intended   to replace advice given to you by your health care provider. Make sure you discuss any questions you have with your health care provider.  

## 2015-05-18 NOTE — Transfer of Care (Signed)
Immediate Anesthesia Transfer of Care Note  Patient: Julie Roberson  Procedure(s) Performed: Procedure(s): HYSTERECTOMY ABDOMINAL BILATERAL SALPINGECTOMY (Bilateral)  Patient Location: PACU  Anesthesia Type:General  Level of Consciousness: awake and oriented  Airway & Oxygen Therapy: Patient Spontanous Breathing and Patient connected to nasal cannula oxygen  Post-op Assessment: Report given to RN and Post -op Vital signs reviewed and stable  Post vital signs: Reviewed and stable  Last Vitals:  Filed Vitals:   05/18/15 0734  BP: 132/88  Pulse: 94  Temp: 36.8 C  Resp: 20    Complications: No apparent anesthesia complications

## 2015-05-18 NOTE — Op Note (Signed)
Preoperative diagnosis: Fibroid uterus, menorrhagia, anemia  Postoperative diagnosis: Same  Procedure: Total abdominal hysterectomy, bilateral salpingectomy  Surgeon: Standley Dakins. Kennon Rounds, M.D.  Asst.: Verita Schneiders, MD  Anesthesia: Lucita Lora, MD  Findings: large fibroid uterus 554 gms, adhesions of bladder to uterus, normal appearing tubes and ovaries  Estimated blood loss: 50 cc  Specimen: Uterus and tubes to pathology  Reason for procedure: 39 y.o. [id number] with history of bleeding and anemia and large abdominal mass consistent with fibroid uterus. The patient desired definitive treatment.  Risks of repeat hysterectomy reviewed.  Risks include but are not limited to bleeding, infection, injury to surrounding structures, including bowel, bladder and ureters, blood clots, and death.  Procedure:  She was taken to the operating room and placed in a supine position. She received 2 gm Ancef and had SCD's in place. Her abdomen and vagina were prepped and draped in the usual sterile fashion. A Foley catheter was placed which drained clear urine throughout the case. A timeout was performed. After adequate anesthesia was assured a transverse incision was made approximately 2 cm above her symphysis pubis through an old scar. The incision was carried down through the subcutaneous tissue to the fascia. Bleeding encountered was cauterized with the Bovie. The fascia was scored the midline and the fascial incision was extended bilaterally.  The fascia was dissected off the underlying rectus muscle superiorly and inferiorly, bluntly laterally and sharply in the midline.The pyramidalis muscles were separated in a transverse fashion. Hemostasis was maintained. The peritoneum was entered with hemostats and the peritoneal incision was extended bilaterally bluntly, taking care to avoid bowel and bladder. The patient was placed in Trendelenburg position and her bowel was packed out of the operative field. The  pelvis was inspected. The uterus was exteriorized.  Kelley clamps were used to elevate the uterus. The round ligaments were identified clamped cut and ligated. A bladder flap was created anteriorly and the bladder was pushed out of the operative site with a moist lap sponge. There were significant adhesions of bladder to uterus which were taken down carefully. The infundibulopelvic ligaments were identified bilaterally. They were clamped, cut, and doubly ligated with removal of right tube in this process. The uterine vessels were clamped, cut and ligated. The remainder of the cervix was separated from its pelvic attachments using the same clamp, cut, ligate technique. Curved Heaney clamps were used to clamp beneath the cervix. The cervix was removed. The vaginal cuff was closed and was noted to be hemostatic. The left tube was removed. The pelvis was irrigated with 1 L of warm normal saline. There was some bleeding on the right side at the level of the uterosacral  And this was ligated with suture. All pedicles were noted to be hemostatic. The sponges were removed. The rectus muscles were inspected and hemostasis was assured. The fascia was closed with a 0 Vicryl running nonlocking suture. The subcutaneous tissue was irrigated, clean, dry. It was then infiltrated with 30 mL of 0.5% Marcaine. A skin was closed with done with 4-0 vicryl on a keith needle. Steri strips applied. She tolerated the procedure well and was taken to the recovery room in stable condition. Her Foley catheter drained clear urine throughout.   Donnamae Jude MD 05/18/2015 10:11 AM

## 2015-05-18 NOTE — Anesthesia Procedure Notes (Signed)
Procedure Name: Intubation Date/Time: 05/18/2015 9:18 AM Performed by: Vernice Jefferson Pre-anesthesia Checklist: Patient identified, Emergency Drugs available, Suction available, Patient being monitored and Timeout performed Patient Re-evaluated:Patient Re-evaluated prior to inductionOxygen Delivery Method: Circle system utilized Preoxygenation: Pre-oxygenation with 100% oxygen Intubation Type: IV induction Ventilation: Mask ventilation without difficulty Grade View: Grade II Tube type: Oral Tube size: 7.0 mm Number of attempts: 1 Airway Equipment and Method: Stylet Placement Confirmation: ETT inserted through vocal cords under direct vision,  positive ETCO2 and breath sounds checked- equal and bilateral Secured at: 22 cm Dental Injury: Teeth and Oropharynx as per pre-operative assessment

## 2015-05-18 NOTE — Interval H&P Note (Signed)
History and Physical Interval Note:  05/18/2015 8:07 AM  [place]  has presented today for surgery, with the diagnosis of Fibroid uterus  The various methods of treatment have been discussed with the patient and family. After consideration of risks, benefits and other options for treatment, the patient has consented to  Procedure(s): HYSTERECTOMY ABDOMINAL (N/A) as a surgical intervention .  The patient's history has been reviewed, patient examined, no change in status, stable for surgery.  I have reviewed the patient's chart and labs.  Questions were answered to the patient's satisfaction.     [place]

## 2015-05-18 NOTE — Anesthesia Preprocedure Evaluation (Signed)
Anesthesia Evaluation  Patient identified by MRN, date of birth, ID band Patient awake    Reviewed: Allergy & Precautions, H&P , NPO status , Patient's Chart, lab work & pertinent test results  Airway Mallampati: II  TM Distance: >3 FB Neck ROM: full    Dental no notable dental hx. (+) Teeth Intact   Pulmonary Current Smoker,    Pulmonary exam normal        Cardiovascular hypertension, Pt. on medications negative cardio ROS Normal cardiovascular exam     Neuro/Psych negative neurological ROS  negative psych ROS   GI/Hepatic negative GI ROS, Neg liver ROS,   Endo/Other  negative endocrine ROS  Renal/GU negative Renal ROS     Musculoskeletal   Abdominal Normal abdominal exam  (+)   Peds  Hematology   Anesthesia Other Findings   Reproductive/Obstetrics negative OB ROS                             Anesthesia Physical Anesthesia Plan  ASA: II  Anesthesia Plan: General   Post-op Pain Management:    Induction: Intravenous  Airway Management Planned: Oral ETT  Additional Equipment:   Intra-op Plan:   Post-operative Plan: Extubation in OR  Informed Consent: I have reviewed the patients History and Physical, chart, labs and discussed the procedure including the risks, benefits and alternatives for the proposed anesthesia with the patient or authorized representative who has indicated his/her understanding and acceptance.   Dental Advisory Given  Plan Discussed with: CRNA and Surgeon  Anesthesia Plan Comments:         Anesthesia Quick Evaluation

## 2015-05-18 NOTE — Anesthesia Postprocedure Evaluation (Signed)
Anesthesia Post Note  Patient: Julie Roberson  Procedure(s) Performed: Procedure(s) (LRB): HYSTERECTOMY ABDOMINAL BILATERAL SALPINGECTOMY (Bilateral)  Anesthesia type: General  Patient location: PACU  Post pain: Pain level controlled  Post assessment: Post-op Vital signs reviewed  Last Vitals:  Filed Vitals:   05/18/15 1010  BP: 127/77  Pulse: 101  Temp: 36.9 C  Resp: 16    Post vital signs: Reviewed  Level of consciousness: sedated  Complications: No apparent anesthesia complications

## 2015-05-19 ENCOUNTER — Encounter (HOSPITAL_COMMUNITY): Payer: Self-pay | Admitting: Family Medicine

## 2015-05-19 LAB — CBC
HEMATOCRIT: 22.5 % — AB (ref 36.0–46.0)
Hemoglobin: 6.6 g/dL — CL (ref 12.0–15.0)
MCH: 20.3 pg — ABNORMAL LOW (ref 26.0–34.0)
MCHC: 29.3 g/dL — AB (ref 30.0–36.0)
MCV: 69.2 fL — ABNORMAL LOW (ref 78.0–100.0)
Platelets: 290 10*3/uL (ref 150–400)
RBC: 3.25 MIL/uL — ABNORMAL LOW (ref 3.87–5.11)
RDW: 22.2 % — ABNORMAL HIGH (ref 11.5–15.5)
WBC: 10.3 10*3/uL (ref 4.0–10.5)

## 2015-05-19 MED ORDER — FERROUS SULFATE 325 (65 FE) MG PO TABS
325.0000 mg | ORAL_TABLET | Freq: Two times a day (BID) | ORAL | Status: DC
  Administered 2015-05-19 – 2015-05-20 (×2): 325 mg via ORAL
  Filled 2015-05-19 (×2): qty 1

## 2015-05-19 NOTE — Addendum Note (Signed)
Addendum  created 05/19/15 0734 by Talbot Grumbling, CRNA   Modules edited: Notes Section   Notes Section:  File: [id number]

## 2015-05-19 NOTE — Anesthesia Postprocedure Evaluation (Signed)
Anesthesia Post Note  Patient: Julie Roberson  Procedure(s) Performed: Procedure(s) (LRB): HYSTERECTOMY ABDOMINAL BILATERAL SALPINGECTOMY (Bilateral)  Anesthesia type: General  Patient location: Mother/Baby  Post pain: Pain level controlled  Post assessment: Post-op Vital signs reviewed  Last Vitals:  Filed Vitals:   05/19/15 0600  BP:   Pulse:   Temp:   Resp: 20    Post vital signs: Reviewed  Level of consciousness: awake and alert   Complications: No apparent anesthesia complications

## 2015-05-19 NOTE — Clinical Documentation Improvement (Signed)
Internal Medicine  Abnormal Lab/Test Results:   Component     Latest Ref Rng 05/19/2015          RBC     3.87 - 5.11 MIL/uL 3.25 (L)  Hemoglobin     12.0 - 15.0 g/dL 6.6 (LL)    Possible Clinical Conditions associated with above indicators  Please exercise your independent, professional judgment when responding. A specific answer is not anticipated or expected.   Thank You,  [place] [phone]

## 2015-05-19 NOTE — Progress Notes (Signed)
1 Day Post-Op Procedure(s) (LRB): HYSTERECTOMY ABDOMINAL BILATERAL SALPINGECTOMY (Bilateral)  Subjective: Patient reports incisional pain and tolerating PO.    Objective: I have reviewed patient's vital signs, intake and output, medications, labs and pathology.  General: alert, cooperative and appears stated age Resp: normal effort GI: incision: clean and intact and soft appropriately tender Extremities: extremities normal, atraumatic, no cyanosis or edema  Labs: CBC    Component Value Date/Time   WBC 10.3 05/19/2015 0513   WBC 11.0 02/05/2015   RBC 3.25* 05/19/2015 0513   HGB 6.6* 05/19/2015 0513   HCT 22.5* 05/19/2015 0513   PLT 290 05/19/2015 0513   MCV 69.2* 05/19/2015 0513   MCH 20.3* 05/19/2015 0513   MCHC 29.3* 05/19/2015 0513   RDW 22.2* 05/19/2015 0513   LYMPHSABS 3.4 02/05/2015 1508   MONOABS 0.8 02/05/2015 1508   EOSABS 0.0 02/05/2015 1508   BASOSABS 0.1 02/05/2015 1508     Assessment: s/p Procedure(s): HYSTERECTOMY ABDOMINAL BILATERAL SALPINGECTOMY (Bilateral): stable, progressing well, tolerating diet and anemia  Plan: Advance diet Encourage ambulation Advance to PO medication Discontinue IV fluids Ambulate tthe halls tid  LOS: 1 day    PRATT,TANYA S 05/19/2015, 12:00 PM

## 2015-05-20 NOTE — Discharge Summary (Signed)
Physician Discharge Summary  Patient ID: Julie Roberson MRN: [id number] DOB/AGE: 04/27/1976 39 y.o.  Admit date: 05/18/2015 Discharge date:   Admission Diagnoses:  Principal Problem:   Fibroid uterus Active Problems:   Menorrhagia with regular cycle   Acute blood loss anemia   Status post hysterectomy   Discharge Diagnoses:  Same  Past Medical History  Diagnosis Date  . Hypertension     Surgeries: Procedure(s): HYSTERECTOMY ABDOMINAL BILATERAL SALPINGECTOMY on 05/18/2015   Consultants:  None  Discharged Condition: Improved  Hospital Course: Julie Roberson is an 39 y.o. female [id number] who was admitted 05/18/2015 with a chief complaint of menorrhagia, fibroid uterus and chronic blood loss anemia found to have a diagnosis of Fibroid uterus.  They were brought to the operating room on 05/18/2015 and underwent the above named procedures.    They were given perioperative antibiotics:  Anti-infectives    Start     Dose/Rate Route Frequency Ordered Stop   05/18/15 0737  ceFAZolin (ANCEF) 2-3 GM-% IVPB SOLR    Comments:  Harvell, Gwendolyn  : cabinet override      05/18/15 0737 05/18/15 1944   05/18/15 0729  ceFAZolin (ANCEF) IVPB 2 g/50 mL premix     2 g 100 mL/hr over 30 Minutes Intravenous On call to O.R. 05/18/15 0729 05/18/15 0900    .  They were given sequential compression devices, early ambulation, and chemoprophylaxis for DVT prophylaxis. Her operation was uncomplicated. For further details about surgery, please refer to the operative report. Patient had an uncomplicated postoperative course. By time of discharge on POD#2, her pain was controlled on oral pain medications; she was ambulating, voiding without difficulty, tolerating regular diet and passing flatus. She was deemed stable for discharge to home.  Discharge Exam: Blood pressure 121/78, pulse 80, temperature 99.5 F (37.5 C), temperature source Oral, resp. rate 16, height 5\' 3"  (1.6 m), weight 120 lb (54.432  kg), SpO2 96 %. General appearance: alert and no distress  Resp: clear to auscultation bilaterally  Cardio: regular rate and rhythm  GI: soft, non-tender; bowel sounds normal; no masses, no organomegaly.  Incision: C/D/I, no erythema, no drainage noted Pelvic: scant blood on pad  Extremities: extremities normal, atraumatic, no cyanosis or edema and Homans sign is negative, no sign of DVT    Recent laboratory studies:  Results for orders placed or performed during the hospital encounter of 05/18/15  Pregnancy, urine  Result Value Ref Range   Preg Test, Ur NEGATIVE NEGATIVE  CBC  Result Value Ref Range   WBC 10.3 4.0 - 10.5 K/uL   RBC 3.25 (L) 3.87 - 5.11 MIL/uL   Hemoglobin 6.6 (LL) 12.0 - 15.0 g/dL   HCT 22.5 (L) 36.0 - 46.0 %   MCV 69.2 (L) 78.0 - 100.0 fL   MCH 20.3 (L) 26.0 - 34.0 pg   MCHC 29.3 (L) 30.0 - 36.0 g/dL   RDW 22.2 (H) 11.5 - 15.5 %   Platelets 290 150 - 400 K/uL  Type and screen  Result Value Ref Range   ABO/RH(D) A POS    Antibody Screen NEG    Sample Expiration 05/21/2015    Unit Number [id number]    Blood Component Type RED CELLS,LR    Unit division 00    Status of Unit ALLOCATED    Transfusion Status OK TO TRANSFUSE    Crossmatch Result Compatible    Unit Number [id number]    Blood Component Type RED CELLS,LR    Unit division 00  Status of Unit ALLOCATED    Transfusion Status OK TO TRANSFUSE    Crossmatch Result Compatible    Unit Number [id number]    Blood Component Type RBC LR PHER2    Unit division 00    Status of Unit REL FROM [facility]    Transfusion Status OK TO TRANSFUSE    Crossmatch Result Compatible    Unit Number [id number]    Blood Component Type RED CELLS,LR    Unit division 00    Status of Unit REL FROM [facility]    Transfusion Status OK TO TRANSFUSE    Crossmatch Result Compatible   Prepare RBC (crossmatch)  Result Value Ref Range   Order Confirmation ORDER PROCESSED BY BLOOD BANK   ABO/Rh  Result Value Ref Range    ABO/RH(D) A POS     Discharge Medications:     Medication List    TAKE these medications        acetaminophen 500 MG tablet  Commonly known as:  TYLENOL  Take 500 mg by mouth every 6 (six) hours as needed for mild pain or headache.     lisinopril-hydrochlorothiazide 10-12.5 MG tablet  Commonly known as:  PRINZIDE,ZESTORETIC  Take 1 tablet by mouth daily.     megestrol 40 MG tablet  Commonly known as:  MEGACE  Take 40 mg by mouth 2 (two) times daily.     mineral/vitamin supplement 70 MG Tabs tablet  Take 1 tablet (70 mg total) by mouth 2 (two) times daily.     oxyCODONE-acetaminophen 5-325 MG tablet  Commonly known as:  PERCOCET/ROXICET  Take 1-2 tablets by mouth every 6 (six) hours as needed.        Disposition: 01-Home or Self Care      Discharge Instructions     Remove dressing in 72 hours    Complete by:  As directed      Call MD for:  extreme fatigue    Complete by:  As directed      Call MD for:  persistant nausea and vomiting    Complete by:  As directed      Call MD for:  redness, tenderness, or signs of infection (pain, swelling, redness, odor or green/yellow discharge around incision site)    Complete by:  As directed      Call MD for:  severe uncontrolled pain    Complete by:  As directed      Call MD for:  temperature >100.4    Complete by:  As directed      Diet - low sodium heart healthy    Complete by:  As directed      Increase activity slowly    Complete by:  As directed      Lifting restrictions    Complete by:  As directed   Nothing > 20 lbs x 6 wks     Sexual Activity Restrictions    Complete by:  As directed   None x 6 wks           Follow-up Information    Follow up with [facility] In 2 weeks.   Specialty:  Obstetrics and Gynecology   Why:  postop check, they will call you with an appointment   Contact information:   [place] [phone]        Signed: Donnamae Jude 05/20/2015, 12:17 PM

## 2015-05-20 NOTE — Progress Notes (Signed)
Patient discharged home with sister... Discharge instructions reviewed with patient and she verbalized understanding... Condition stable... No equipment... Ambulated to main entrance with Astrid Divine, NT.

## 2015-05-20 NOTE — Progress Notes (Signed)
Patient stated she was taking her own pain med.  Instructed patient that she needed to ask staff for pain med, that she is not suppose to take any thing unless MD orders. Patient showed me a bottle of Acetaminophen 500mg  that she got out of her purse and stated "this is what I took."

## 2015-05-22 LAB — TYPE AND SCREEN
ABO/RH(D): A POS
ANTIBODY SCREEN: NEGATIVE
UNIT DIVISION: 0
Unit division: 0
Unit division: 0
Unit division: 0

## 2015-05-25 ENCOUNTER — Encounter: Payer: Self-pay | Admitting: *Deleted

## 2015-06-07 ENCOUNTER — Encounter: Payer: Medicaid Other | Admitting: Family Medicine

## 2016-12-30 IMAGING — US US TRANSVAGINAL NON-OB
1 series · 14 of 25 positions shown · non-contrast
Comparison: 03/02/2013.

CLINICAL DATA: Uterine fibroids.  Menorrhagia.



[Series 1: us non-ob tv/pel · 14 of 62 slices shown]
[im 1/62]
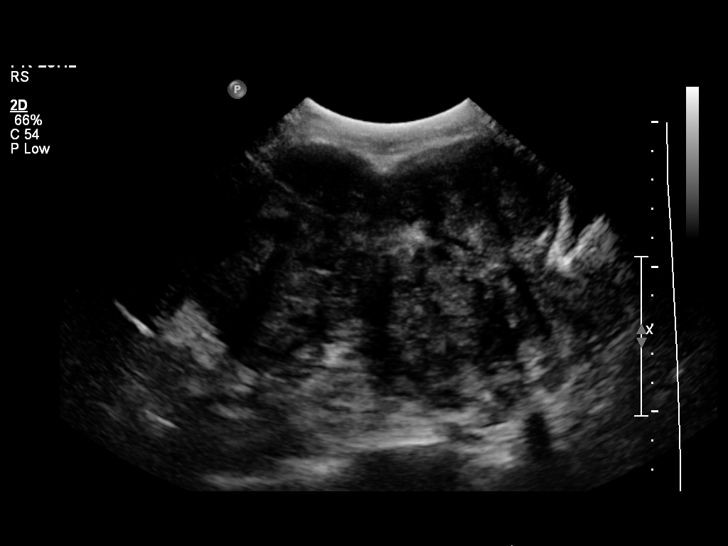
[im 6/62]
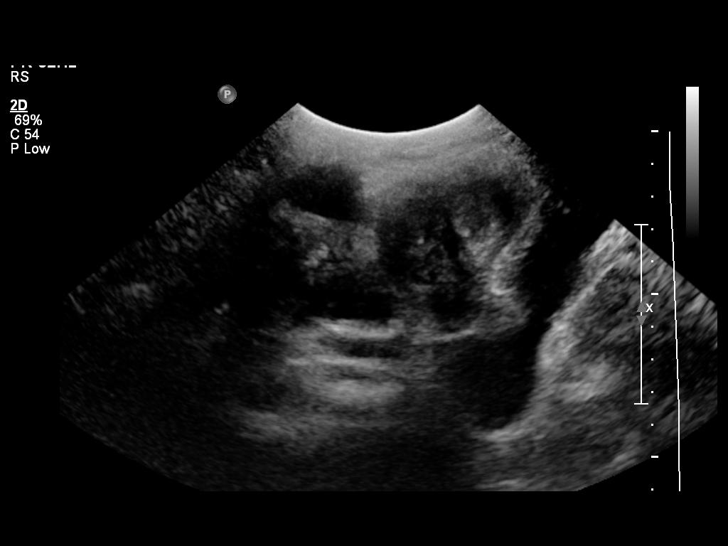
[im 11/62]
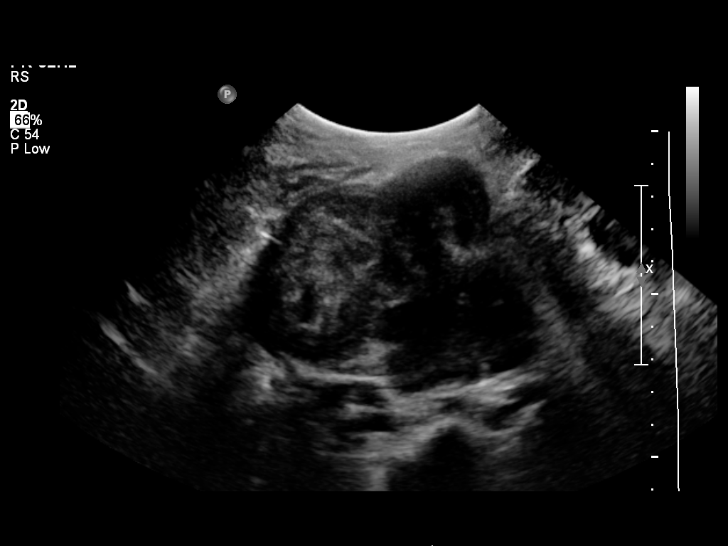
[im 16/62]
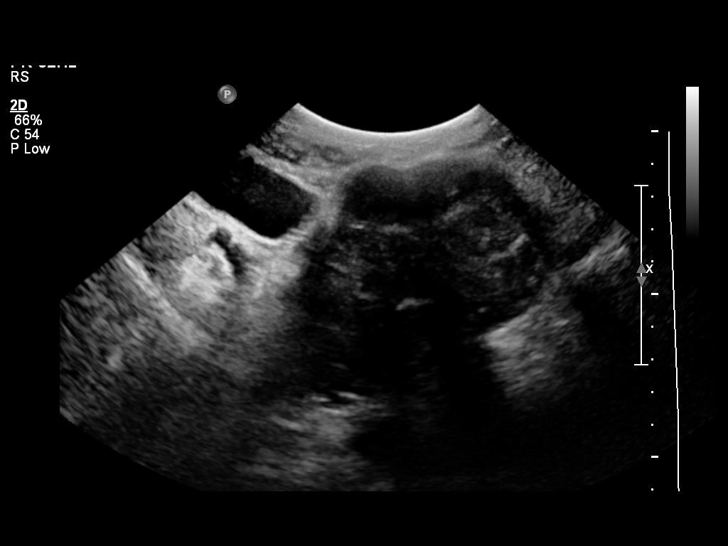
[im 21/62]
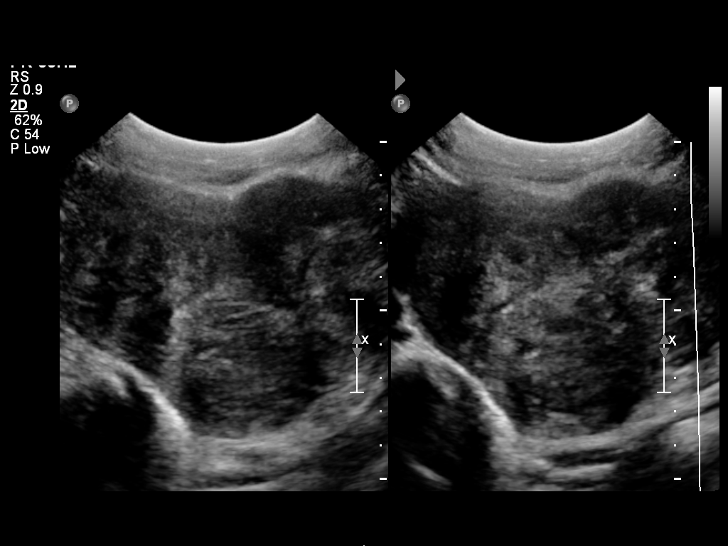
[im 23/62]
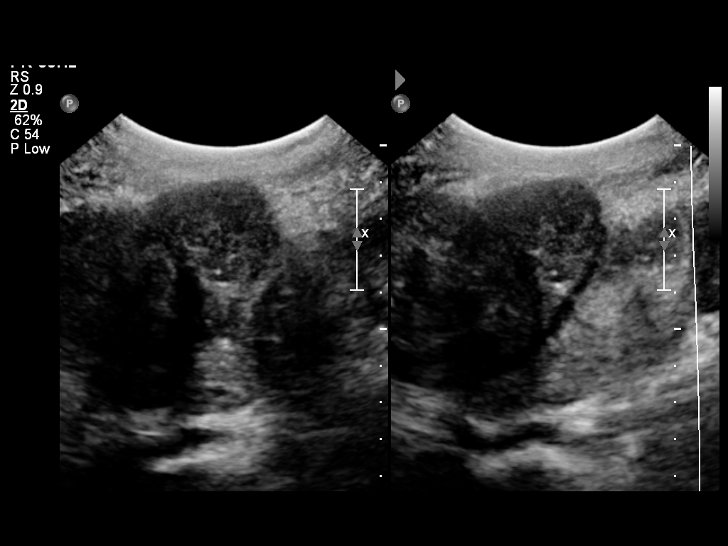
[im 28/62]
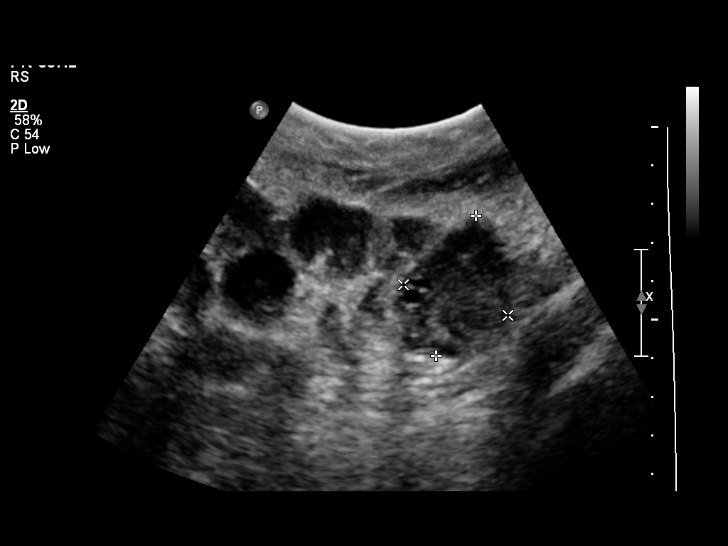
[im 34/62]
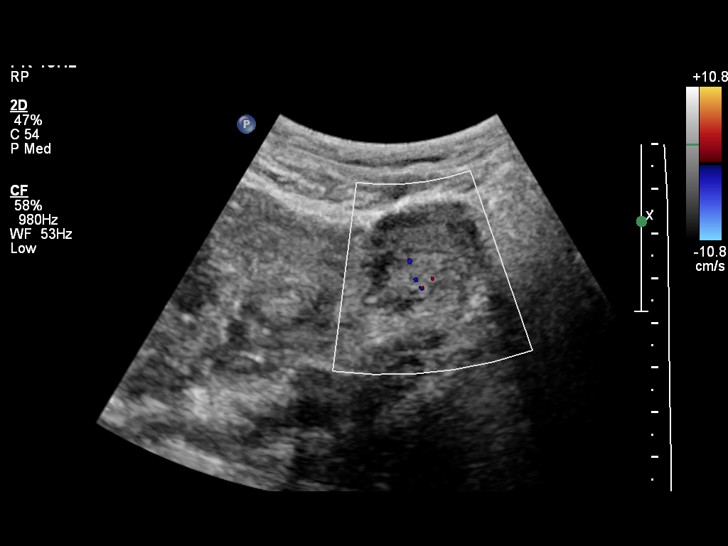
[im 39/62]
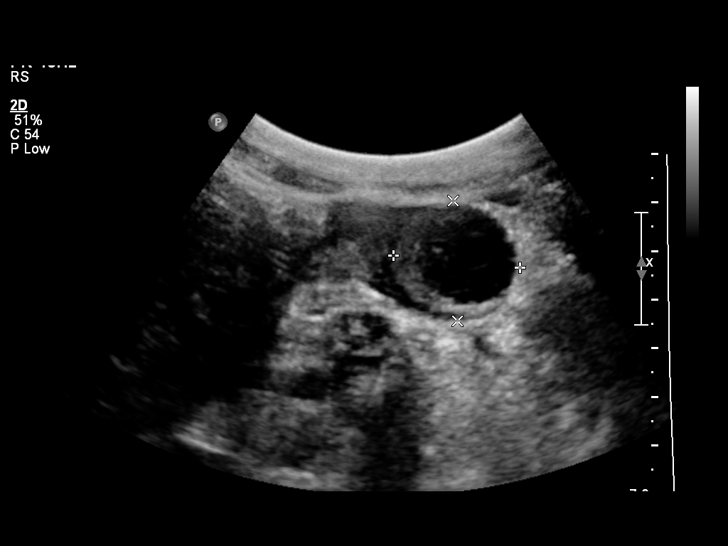
[im 41/62]
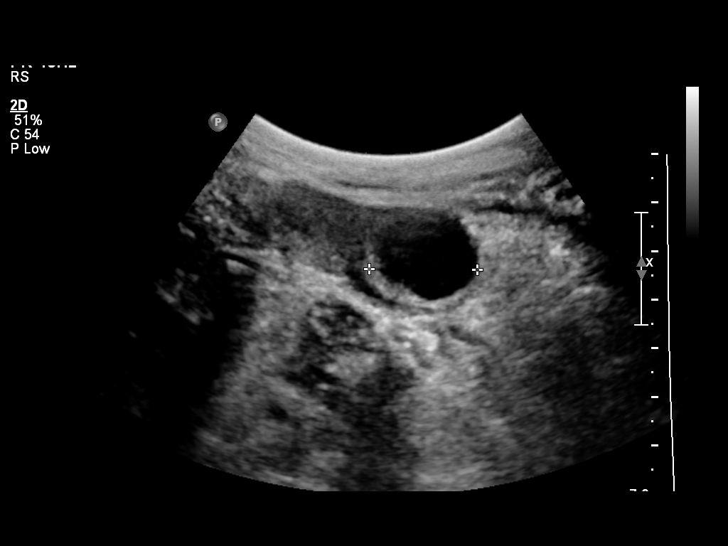
[im 46/62]
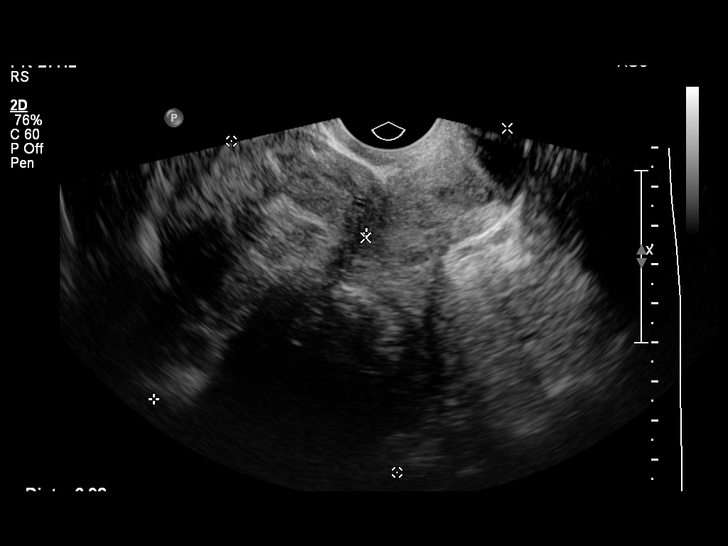
[im 51/62]
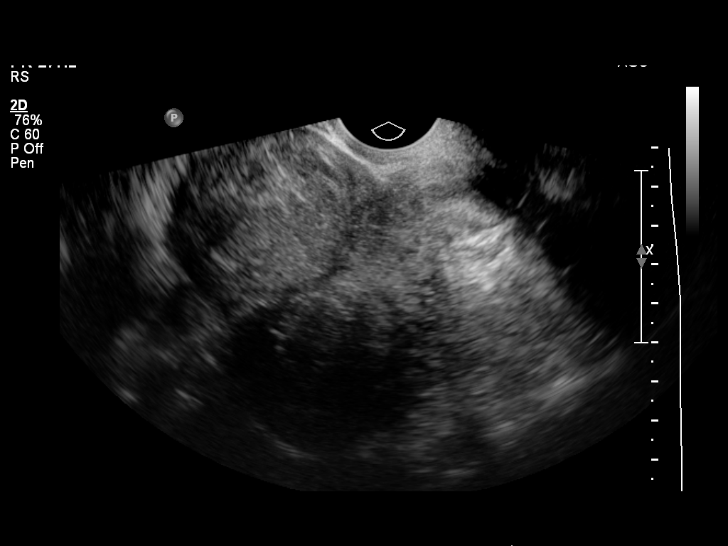
[im 56/62]
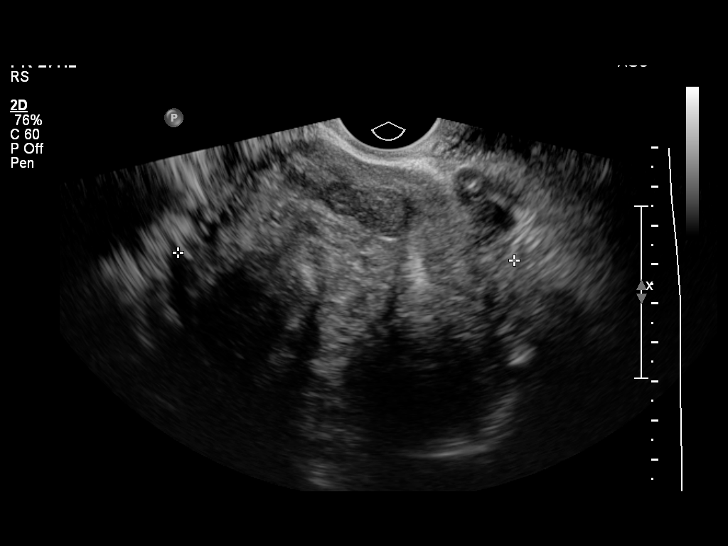
[im 62/62]
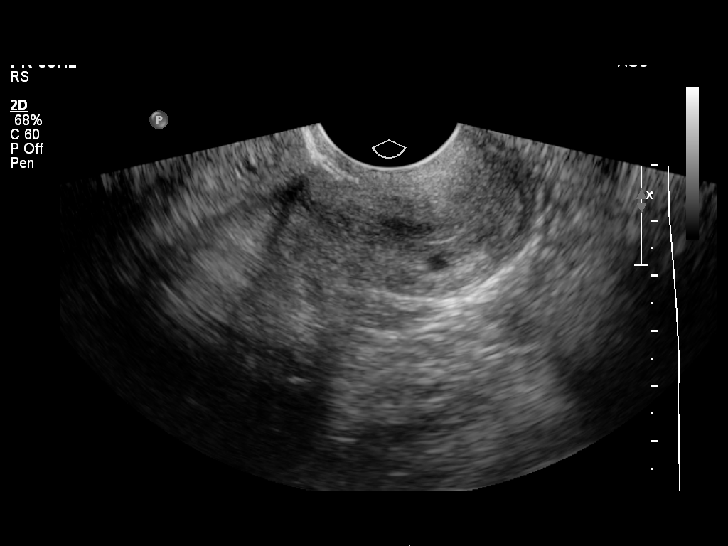

[14 of 25 positions shown; findings below may reference images not displayed]

FINDINGS: Uterus

Measurements: 13.7 x 7.6 x 8.7 cm. Multiple uterine fibroids. 6.3 cm
fundal fibroid. 5.0 cm anterior lower uterine segment fibroid.
posterior fibroid. 3.6 cm anterior fibroid.

Endometrium

Thickness: Not visualized due to numerous fibroids.

Right ovary

Measurements: 3.0 x 1.7 x 1.4 cm. Normal appearance/no adnexal mass.

Left ovary

Measurements: 3.8 x 2.8 x 4.0 cm. 2.6 cm complex cystic structure
within the left ovary, likely hemorrhagic follicle.

Other findings

No free fluid.
IMPRESSION: Enlarged fibroid uterus, similar to prior study.

Small hemorrhagic follicle within the left ovary.

No acute findings.

## 2017-10-08 ENCOUNTER — Ambulatory Visit (HOSPITAL_COMMUNITY): Payer: Self-pay | Admitting: Psychiatry

## 2018-01-17 ENCOUNTER — Encounter (HOSPITAL_COMMUNITY): Payer: Self-pay | Admitting: *Deleted

## 2018-01-17 ENCOUNTER — Emergency Department (HOSPITAL_COMMUNITY)
Admission: EM | Admit: 2018-01-17 | Discharge: 2018-01-18 | Disposition: A | Discharge status: Home / Self Care | Payer: Medicaid Other | Attending: Emergency Medicine | Admitting: Emergency Medicine

## 2018-01-17 DIAGNOSIS — R45850 Homicidal ideations: Secondary | ICD-10-CM

## 2018-01-17 DIAGNOSIS — F419 Anxiety disorder, unspecified: Secondary | ICD-10-CM

## 2018-01-17 DIAGNOSIS — F17210 Nicotine dependence, cigarettes, uncomplicated: Secondary | ICD-10-CM | POA: Insufficient documentation

## 2018-01-17 DIAGNOSIS — F1994 Other psychoactive substance use, unspecified with psychoactive substance-induced mood disorder: Secondary | ICD-10-CM

## 2018-01-17 DIAGNOSIS — R443 Hallucinations, unspecified: Secondary | ICD-10-CM | POA: Insufficient documentation

## 2018-01-17 DIAGNOSIS — R45851 Suicidal ideations: Secondary | ICD-10-CM | POA: Insufficient documentation

## 2018-01-17 DIAGNOSIS — F329 Major depressive disorder, single episode, unspecified: Secondary | ICD-10-CM | POA: Diagnosis present

## 2018-01-17 DIAGNOSIS — R112 Nausea with vomiting, unspecified: Secondary | ICD-10-CM | POA: Insufficient documentation

## 2018-01-17 DIAGNOSIS — F32A Depression, unspecified: Secondary | ICD-10-CM | POA: Diagnosis present

## 2018-01-17 DIAGNOSIS — Z79899 Other long term (current) drug therapy: Secondary | ICD-10-CM | POA: Insufficient documentation

## 2018-01-17 DIAGNOSIS — I10 Essential (primary) hypertension: Secondary | ICD-10-CM

## 2018-01-17 LAB — RAPID URINE DRUG SCREEN, HOSP PERFORMED
AMPHETAMINES: NOT DETECTED
BENZODIAZEPINES: NOT DETECTED
Barbiturates: NOT DETECTED
Cocaine: NOT DETECTED
OPIATES: NOT DETECTED
TETRAHYDROCANNABINOL: POSITIVE — AB

## 2018-01-17 LAB — COMPREHENSIVE METABOLIC PANEL
ALBUMIN: 4.2 g/dL (ref 3.5–5.0)
ALK PHOS: 73 U/L (ref 38–126)
ALT: 36 U/L (ref 14–54)
ANION GAP: 13 (ref 5–15)
AST: 65 U/L — ABNORMAL HIGH (ref 15–41)
BUN: <5 mg/dL — ABNORMAL LOW (ref 6–20)
CHLORIDE: 106 mmol/L (ref 101–111)
CO2: 18 mmol/L — AB (ref 22–32)
Calcium: 10.3 mg/dL (ref 8.9–10.3)
Creatinine, Ser: 0.47 mg/dL (ref 0.44–1.00)
GFR calc Af Amer: >60 mL/min (ref >60)
GFR calc non Af Amer: >60 mL/min (ref >60)
GLUCOSE: 72 mg/dL (ref 65–99)
Potassium: 3.9 mmol/L (ref 3.5–5.1)
SODIUM: 137 mmol/L (ref 135–145)
Total Bilirubin: 0.6 mg/dL (ref 0.3–1.2)
Total Protein: 7.1 g/dL (ref 6.5–8.1)

## 2018-01-17 LAB — I-STAT BETA HCG BLOOD, ED (MC, WL, AP ONLY)

## 2018-01-17 LAB — CBC
HEMATOCRIT: 39.4 % (ref 36.0–46.0)
HEMOGLOBIN: 13.4 g/dL (ref 12.0–15.0)
MCH: 34.5 pg — ABNORMAL HIGH (ref 26.0–34.0)
MCHC: 34 g/dL (ref 30.0–36.0)
MCV: 101.5 fL — ABNORMAL HIGH (ref 78.0–100.0)
Platelets: 204 10*3/uL (ref 150–400)
RBC: 3.88 MIL/uL (ref 3.87–5.11)
RDW: 13.3 % (ref 11.5–15.5)
WBC: 8.5 10*3/uL (ref 4.0–10.5)

## 2018-01-17 LAB — SALICYLATE LEVEL

## 2018-01-17 LAB — ETHANOL: Alcohol, Ethyl (B): <10 mg/dL (ref <10)

## 2018-01-17 LAB — ACETAMINOPHEN LEVEL

## 2018-01-17 MED ORDER — LORAZEPAM 2 MG/ML IJ SOLN: 0.0000 mg | Freq: Two times a day (BID) | INTRAMUSCULAR | Status: DC

## 2018-01-17 MED ORDER — VITAMIN B-1 100 MG PO TABS
100.0000 mg | ORAL_TABLET | Freq: Every day | ORAL | Status: DC
  Administered 2018-01-17 – 2018-01-18 (×2): 100 mg via ORAL
  Filled 2018-01-17 (×2): qty 1

## 2018-01-17 MED ORDER — LISINOPRIL-HYDROCHLOROTHIAZIDE 10-12.5 MG PO TABS: 1.0000 | ORAL_TABLET | Freq: Every day | ORAL | Status: DC

## 2018-01-17 MED ORDER — HYDROCHLOROTHIAZIDE 12.5 MG PO CAPS
12.5000 mg | ORAL_CAPSULE | Freq: Every day | ORAL | Status: DC
  Administered 2018-01-17 – 2018-01-18 (×2): 12.5 mg via ORAL
  Filled 2018-01-17 (×2): qty 1

## 2018-01-17 MED ORDER — LORAZEPAM 1 MG PO TABS: 0.0000 mg | ORAL_TABLET | Freq: Two times a day (BID) | ORAL | Status: DC

## 2018-01-17 MED ORDER — LISINOPRIL 10 MG PO TABS
10.0000 mg | ORAL_TABLET | Freq: Every day | ORAL | Status: DC
  Administered 2018-01-17 – 2018-01-18 (×2): 10 mg via ORAL
  Filled 2018-01-17 (×2): qty 1

## 2018-01-17 MED ORDER — ACETAMINOPHEN 325 MG PO TABS: 650.0000 mg | ORAL_TABLET | ORAL | Status: DC | PRN

## 2018-01-17 MED ORDER — THIAMINE HCL 100 MG/ML IJ SOLN: 100.0000 mg | Freq: Every day | INTRAMUSCULAR | Status: DC

## 2018-01-17 MED ORDER — ONDANSETRON HCL 4 MG PO TABS: 4.0000 mg | ORAL_TABLET | Freq: Three times a day (TID) | ORAL | Status: DC | PRN

## 2018-01-17 MED ORDER — NICOTINE 21 MG/24HR TD PT24
21.0000 mg | MEDICATED_PATCH | Freq: Every day | TRANSDERMAL | Status: DC
  Administered 2018-01-17 – 2018-01-18 (×2): 21 mg via TRANSDERMAL
  Filled 2018-01-17 (×2): qty 1

## 2018-01-17 MED ORDER — LORAZEPAM 2 MG/ML IJ SOLN: 0.0000 mg | Freq: Four times a day (QID) | INTRAMUSCULAR | Status: DC

## 2018-01-17 MED ORDER — LORAZEPAM 1 MG PO TABS
0.0000 mg | ORAL_TABLET | Freq: Four times a day (QID) | ORAL | Status: DC
  Administered 2018-01-18: 2 mg via ORAL
  Filled 2018-01-17: qty 2

## 2018-01-17 NOTE — ED Provider Notes (Signed)
Care of patient assumed from Eliezer Mccoy at 1700. Please see her note for full H&P.  Briefly, patient is a 42 year old female who presents with suicidal ideation. Labs and psych evaluation pending at time of transfer of care.  Labs stable. Psych evaluated patient, recommending inpatient treatment. Pending admission. Patient required no further intervention during my shift.  Patient and plan of care discussed with Attending physician, Dr. Alvino Chapel.       Arnetha Massy, MD 01/17/18 2340    Davonna Belling, MD 01/18/18 [phone]

## 2018-01-17 NOTE — ED Notes (Signed)
Patient currently denies SI/HI/AH. Plan of care discussed. Encouragement and support provided and safety maintain. Q 15 min safety checks remain in place and video monitoring.

## 2018-01-17 NOTE — ED Notes (Signed)
Bed: [facility] Expected date:  Expected time:  Means of arrival:  Comments: 

## 2018-01-17 NOTE — ED Triage Notes (Signed)
Pt is here for suicidal ideation. Pt denies plan. Pt states she doesn't want to live. Pt states she hears voices telling her she is worthless. Pt states this has been going on the past few weeks.
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
## 2018-01-18 DIAGNOSIS — R441 Visual hallucinations: Secondary | ICD-10-CM

## 2018-01-18 DIAGNOSIS — F329 Major depressive disorder, single episode, unspecified: Secondary | ICD-10-CM

## 2018-01-18 DIAGNOSIS — F17210 Nicotine dependence, cigarettes, uncomplicated: Secondary | ICD-10-CM

## 2018-01-18 DIAGNOSIS — F419 Anxiety disorder, unspecified: Secondary | ICD-10-CM

## 2018-01-18 NOTE — Patient Outreach (Signed)
ED Peer Support Specialist Patient Intake (Complete at intake & 30-60 Day Follow-up)  Name: Julie Roberson  MRN: [id number]  Age: 42 y.o.   Date of Admission: 01/18/2018  Intake: Initial Comments:      Primary Reason Admitted: SI with no plan, passive HI towards her boyfriend, A/V H, depression, PTSD, poly substance use with cannabinoids and alcohol  Lab values: Alcohol/ETOH: Negative Positive UDS? Yes Amphetamines: No Barbiturates: No Benzodiazepines: No Cocaine: No Opiates: No Cannabinoids: Yes  Demographic information: Gender: Female Ethnicity: African American Marital Status: Single Insurance Status: Medicaid Ecologist (Work Neurosurgeon, Physicist, medical, etc.: No Lives with: Partner/Spouse Living situation: House/Apartment  Reported Patient History: Patient reported health conditions: Depression, Other (comment), Trauma(PTSD) Patient aware of HIV and hepatitis status: Yes (comment)(Negative)  In past year, has patient visited ED for any reason? No  Number of ED visits:    Reason(s) for visit:    In past year, has patient been hospitalized for any reason? No  Number of hospitalizations:    Reason(s) for hospitalization:    In past year, has patient been arrested? Yes  Number of arrests: 1  Reason(s) for arrest: Driving with a revoked license in April 6701  In past year, has patient been incarcerated? Yes  Number of incarcerations: 1  Reason(s) for incarceration: Driving with a revoked license in April 4103  In past year, has patient received medication-assisted treatment? No  In past year, patient received the following treatments:    In past year, has patient received any harm reduction services? No  Did this include any of the following?    In past year, has patient received care from a mental health provider for diagnosis other than SUD? No  In past year, is this first time patient has overdosed? (Has not experienced a  drug overdose)  Number of past overdoses:    In past year, is this first time patient has been hospitalized for an overdose? (has not experienced a drug overdose)  Number of hospitalizations for overdose(s):    Is patient currently receiving treatment for a mental health diagnosis? No  Patient reports experiencing difficulty participating in SUD treatment: No    Most important reason(s) for this difficulty?    Has patient received prior services for treatment? No  In past, patient has received services from following agencies:    Plan of Care:  Suggested follow up at these agencies/treatment centers: (Patient is interested in substance use recovery support group and outpatient substance use treatment. [facility] provided the patient with these resources. )  Other information: [facility] met with the patient and provided substance use recovery support. Patient expressed interested in cutting down on her alcohol/drug use in order to eventually quit using. Patient states that she drinks alcohol and smokes marijuana to help with anxiety and to help the patient sleep. [facility] talked to the patient about how substance use support groups/outpatient treatment can help individuals learn skills on how to healthily deal with insomnia or anxiety without using drugs/alcohol. [facility] provided information for the [facility] for their substance use/mental health recovery support group and NA meeting list. [facility] also provided and explained information regarding [facility] and [facility] outpatient substance use recovery services. [facility] also provided [facility] contact information. [facility] highly encouraged the patient to contact [facility] for further substance use recovery support or for help with substance use recovery resources.    Mason Jim, [facility]  01/18/2018 1:54 PM
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
## 2018-01-18 NOTE — BH Assessment (Signed)
[facility] Assessment Progress Note  Per Buford Dresser, DO, this pt does not require psychiatric hospitalization at this time.  Pt is to be discharged from [facility] with recommendation to follow up with [facility].  This has been included in pt's discharge instructions.  Pt would also benefit from seeing Peer Support Specialists; they will be asked to speak to pt.  Pt's nurse, Diane, has been notified.  Jalene Mullet, [place] Triage Specialist [phone]

## 2018-01-18 NOTE — Discharge Instructions (Signed)
For your behavioral health needs you are advised to follow up with [facility].  New patients are seen at their walk-in clinic.  Walk-in hours are Monday - Friday from 8:00 am - 12:00 pm, and from 1:00 pm - 3:00 pm.  Walk-in patients are seen on a first come, first served basis, so try to arrive as early as possible for the best chance of being seen the same day.  There is an initial fee of $22.50: ° °     [facility] °     [address] °     [place], [place] [address] °     [phone] °

## 2018-01-18 NOTE — ED Notes (Signed)
Pt discharged home. Discharged instructions read to pt who verbalized understanding. All belongings returned to pt who signed for same. Denies SI/HI, is not delusional and not responding to internal stimuli. Escorted pt to the ED exit.    

## 2019-08-21 ENCOUNTER — Encounter (HOSPITAL_COMMUNITY): Payer: Self-pay | Admitting: Emergency Medicine

## 2019-08-21 ENCOUNTER — Other Ambulatory Visit: Payer: Self-pay

## 2019-08-21 ENCOUNTER — Emergency Department (HOSPITAL_COMMUNITY): Payer: Self-pay

## 2019-08-21 ENCOUNTER — Emergency Department (HOSPITAL_COMMUNITY)
Admission: EM | Admit: 2019-08-21 | Discharge: 2019-08-21 | Disposition: A | Discharge status: Home / Self Care | Payer: Self-pay | Attending: Emergency Medicine | Admitting: Emergency Medicine

## 2019-08-21 DIAGNOSIS — F17210 Nicotine dependence, cigarettes, uncomplicated: Secondary | ICD-10-CM | POA: Insufficient documentation

## 2019-08-21 DIAGNOSIS — R112 Nausea with vomiting, unspecified: Secondary | ICD-10-CM

## 2019-08-21 DIAGNOSIS — K625 Hemorrhage of anus and rectum: Secondary | ICD-10-CM | POA: Insufficient documentation

## 2019-08-21 DIAGNOSIS — R040 Epistaxis: Secondary | ICD-10-CM | POA: Insufficient documentation

## 2019-08-21 DIAGNOSIS — I10 Essential (primary) hypertension: Secondary | ICD-10-CM | POA: Insufficient documentation

## 2019-08-21 DIAGNOSIS — R5383 Other fatigue: Secondary | ICD-10-CM

## 2019-08-21 LAB — URINALYSIS, ROUTINE W REFLEX MICROSCOPIC
Bilirubin Urine: NEGATIVE
Glucose, UA: NEGATIVE mg/dL
Hgb urine dipstick: NEGATIVE
Ketones, ur: NEGATIVE mg/dL
Leukocytes,Ua: NEGATIVE
Nitrite: NEGATIVE
Protein, ur: NEGATIVE mg/dL
Specific Gravity, Urine: 1.002 — ABNORMAL LOW (ref 1.005–1.030)
pH: 6 (ref 5.0–8.0)

## 2019-08-21 LAB — CBC WITH DIFFERENTIAL/PLATELET
Abs Immature Granulocytes: 0.05 10*3/uL (ref 0.00–0.07)
Basophils Absolute: 0.1 10*3/uL (ref 0.0–0.1)
Basophils Relative: 1 %
Eosinophils Absolute: 0 10*3/uL (ref 0.0–0.5)
Eosinophils Relative: 0 %
HCT: 37.5 % (ref 36.0–46.0)
Hemoglobin: 12.9 g/dL (ref 12.0–15.0)
Immature Granulocytes: 1 %
Lymphocytes Relative: 34 %
Lymphs Abs: 2.9 10*3/uL (ref 0.7–4.0)
MCH: 35.9 pg — ABNORMAL HIGH (ref 26.0–34.0)
MCHC: 34.4 g/dL (ref 30.0–36.0)
MCV: 104.5 fL — ABNORMAL HIGH (ref 80.0–100.0)
Monocytes Absolute: 0.6 10*3/uL (ref 0.1–1.0)
Monocytes Relative: 7 %
Neutro Abs: 5.1 10*3/uL (ref 1.7–7.7)
Neutrophils Relative %: 57 %
Platelets: 200 10*3/uL (ref 150–400)
RBC: 3.59 MIL/uL — ABNORMAL LOW (ref 3.87–5.11)
RDW: 13.8 % (ref 11.5–15.5)
WBC: 8.7 10*3/uL (ref 4.0–10.5)
nRBC: 0 % (ref 0.0–0.2)

## 2019-08-21 LAB — COMPREHENSIVE METABOLIC PANEL
ALT: 24 U/L (ref 0–44)
AST: 45 U/L — ABNORMAL HIGH (ref 15–41)
Albumin: 3.8 g/dL (ref 3.5–5.0)
Alkaline Phosphatase: 81 U/L (ref 38–126)
Anion gap: 12 (ref 5–15)
BUN: <5 mg/dL — ABNORMAL LOW (ref 6–20)
CO2: 20 mmol/L — ABNORMAL LOW (ref 22–32)
Calcium: 10.1 mg/dL (ref 8.9–10.3)
Chloride: 103 mmol/L (ref 98–111)
Creatinine, Ser: 0.4 mg/dL — ABNORMAL LOW (ref 0.44–1.00)
GFR calc Af Amer: >60 mL/min (ref >60)
GFR calc non Af Amer: >60 mL/min (ref >60)
Glucose, Bld: 86 mg/dL (ref 70–99)
Potassium: 4.3 mmol/L (ref 3.5–5.1)
Sodium: 135 mmol/L (ref 135–145)
Total Bilirubin: 0.6 mg/dL (ref 0.3–1.2)
Total Protein: 6.8 g/dL (ref 6.5–8.1)

## 2019-08-21 LAB — TYPE AND SCREEN
ABO/RH(D): A POS
Antibody Screen: NEGATIVE

## 2019-08-21 LAB — TSH: TSH: 4 u[IU]/mL (ref 0.350–4.500)

## 2019-08-21 LAB — ABO/RH: ABO/RH(D): A POS

## 2019-08-21 LAB — PROTIME-INR
INR: 1 (ref 0.8–1.2)
Prothrombin Time: 12.6 seconds (ref 11.4–15.2)

## 2019-08-21 LAB — MAGNESIUM: Magnesium: 2 mg/dL (ref 1.7–2.4)

## 2019-08-21 LAB — TROPONIN I (HIGH SENSITIVITY)
Troponin I (High Sensitivity): 3 ng/L (ref <18)
Troponin I (High Sensitivity): 3 ng/L (ref <18)

## 2019-08-21 LAB — LIPASE, BLOOD: Lipase: 37 U/L (ref 11–51)

## 2019-08-21 MED ORDER — OMEPRAZOLE 20 MG PO CPDR: 20.0000 mg | DELAYED_RELEASE_CAPSULE | Freq: Every day | ORAL | 0 refills | Status: DC

## 2019-08-21 MED ORDER — ONDANSETRON HCL 4 MG PO TABS: 4.0000 mg | ORAL_TABLET | Freq: Three times a day (TID) | ORAL | 0 refills | Status: DC | PRN

## 2019-08-21 NOTE — Discharge Instructions (Signed)
Your work-up today was overall reassuring.  We sent many tests off to look for anemia, or other problems going on in your abdomen contributing to your nausea, vomiting, and the concern for bleeding.  Your work-up did not shows any these concerning findings.  We do feel you are safe for discharge home but please follow-up with your primary doctor and PCP for further management.  They may want you to follow-up with the gastroenterology team due to the nausea and vomiting and possible upper GI bleeding.  Please take the Prilosec to help with the stomach and the nausea medicine to help maintain your hydration.  If any symptoms change or worsen, please return to the nearest emergency department.  Please stay hydrated and rest.

## 2019-08-21 NOTE — ED Triage Notes (Signed)
Pt. Stated, I just started treatment for suicidal

## 2019-08-21 NOTE — ED Triage Notes (Signed)
Pt. Stated, Julie Roberson had bleeding from my anal area for 20 years , for 3 weeks its in my vomit. I also have had a nose bleed which I have never had.

## 2019-08-21 NOTE — ED Provider Notes (Signed)
[facility] Provider Note   CSN: [id number] Arrival date & time: 08/21/19  1123     History Chief Complaint  Patient presents with  . Rectal Bleeding  . Emesis  . Epistaxis    Julie Roberson is a 43 y.o. female.  The history is provided by the patient and medical records. No language interpreter was used.  Emesis Severity:  Moderate Duration:  3 weeks Timing:  Intermittent Progression:  Unchanged Chronicity:  New Recent urination:  Normal Context: post-tussive   Relieved by:  Nothing Worsened by:  Nothing Ineffective treatments:  None tried Associated symptoms: no abdominal pain, no chills, no cough, no diarrhea, no fever, no headaches and no myalgias        Past Medical History:  Diagnosis Date  . Hypertension     Patient Active Problem List   Diagnosis Date Noted  . Anxiety and depression   . Status post hysterectomy 05/18/2015  . Fibroid uterus 02/17/2015  . Menorrhagia with regular cycle 02/17/2015  . Acute blood loss anemia 02/17/2015  . Weight loss 02/17/2015    Past Surgical History:  Procedure Laterality Date  . ABDOMINAL HYSTERECTOMY Bilateral 05/18/2015   Procedure: HYSTERECTOMY ABDOMINAL BILATERAL SALPINGECTOMY;  Surgeon: Donnamae Jude, MD;  Location: [facility];  Service: Gynecology;  Laterality: Bilateral;  . CESAREAN SECTION       OB History    Gravida  10   Para  2   Term      Preterm  2   AB  8   Living  2     SAB  1   TAB  7   Ectopic      Multiple      Live Births              No family history on file.  Social History   Tobacco Use  . Smoking status: Current Every Day Smoker    Packs/day: 0.25    Types: Cigarettes  . Smokeless tobacco: Never Used  Substance Use Topics  . Alcohol use: Yes    Alcohol/week: 7.0 standard drinks    Types: 7 Standard drinks or equivalent per week    Comment: 40 oz of alcohol per day  . Drug use: No    Home Medications Prior to  Admission medications   Not on File    Allergies    Patient has no known allergies.  Review of Systems   Review of Systems  Constitutional: Positive for appetite change, fatigue and unexpected weight change. Negative for chills, diaphoresis and fever.  HENT: Negative for congestion.   Respiratory: Negative for cough, chest tightness, shortness of breath, wheezing and stridor.   Cardiovascular: Negative for chest pain, palpitations and leg swelling.  Gastrointestinal: Positive for nausea and vomiting. Negative for abdominal distention, abdominal pain, constipation and diarrhea.  Genitourinary: Negative for dysuria and flank pain.  Musculoskeletal: Negative for back pain, myalgias, neck pain and neck stiffness.  Skin: Negative for rash and wound.  Neurological: Negative for light-headedness, numbness and headaches.  Psychiatric/Behavioral: Negative for agitation and confusion.  All other systems reviewed and are negative.   Physical Exam Updated Vital Signs BP (!) 185/104 (BP Location: Right Arm)   Pulse 92   Temp 98.7 F (37.1 C) (Oral)   Resp 18   LMP 04/07/2015   SpO2 100%   Physical Exam Vitals and nursing note reviewed.  Constitutional:      General: She is not in acute distress.  Appearance: She is well-developed. She is not ill-appearing, toxic-appearing or diaphoretic.  HENT:     Head: Normocephalic and atraumatic.     Right Ear: External ear normal.     Left Ear: External ear normal.     Nose: Nose normal. No congestion or rhinorrhea.     Mouth/Throat:     Mouth: Mucous membranes are moist.     Pharynx: No oropharyngeal exudate or posterior oropharyngeal erythema.  Eyes:     Extraocular Movements: Extraocular movements intact.     Conjunctiva/sclera: Conjunctivae normal.     Pupils: Pupils are equal, round, and reactive to light.  Cardiovascular:     Rate and Rhythm: Normal rate.     Pulses: Normal pulses.     Heart sounds: No murmur.  Pulmonary:      Effort: No respiratory distress.     Breath sounds: No stridor. No wheezing, rhonchi or rales.  Chest:     Chest wall: No tenderness.  Abdominal:     General: Abdomen is flat. There is no distension.     Tenderness: There is no abdominal tenderness. There is no right CVA tenderness, left CVA tenderness or rebound.  Musculoskeletal:        General: No tenderness.     Cervical back: Normal range of motion and neck supple.  Skin:    General: Skin is warm.     Coloration: Skin is not pale.     Findings: No erythema or rash.  Neurological:     General: No focal deficit present.     Mental Status: She is alert and oriented to person, place, and time.     Sensory: No sensory deficit.     Motor: No weakness or abnormal muscle tone.     Deep Tendon Reflexes: Reflexes are normal and symmetric.  Psychiatric:        Mood and Affect: Mood normal.     ED Results / Procedures / Treatments   Labs (all labs ordered are listed, but only abnormal results are displayed) Labs Reviewed  CBC WITH DIFFERENTIAL/PLATELET - Abnormal; Notable for the following components:      Result Value   RBC 3.59 (*)    MCV 104.5 (*)    MCH 35.9 (*)    All other components within normal limits  COMPREHENSIVE METABOLIC PANEL - Abnormal; Notable for the following components:   CO2 20 (*)    BUN <5 (*)    Creatinine, Ser 0.40 (*)    AST 45 (*)    All other components within normal limits  URINALYSIS, ROUTINE W REFLEX MICROSCOPIC - Abnormal; Notable for the following components:   Color, Urine STRAW (*)    Specific Gravity, Urine 1.002 (*)    All other components within normal limits  URINE CULTURE  PROTIME-INR  TSH  MAGNESIUM  LIPASE, BLOOD  PARATHYROID HORMONE, INTACT (NO CA)  TYPE AND SCREEN  ABO/RH  TROPONIN I (HIGH SENSITIVITY)  TROPONIN I (HIGH SENSITIVITY)    EKG EKG Interpretation  Date/Time:  Thursday August 21 2019 15:45:24 EST Ventricular Rate:  74 PR Interval:    QRS Duration: 85 QT  Interval:  380 QTC Calculation: 422 R Axis:   90 Text Interpretation: Sinus rhythm Borderline right axis deviation ST elev, probable normal early repol pattern When compared to prior, slower rate. No sTEMI Confirmed by Antony Blackbird [id number]) on 08/21/2019 3:56:17 PM   Radiology DG Chest 2 View  Result Date: 08/21/2019 CLINICAL DATA:  Fatigue and hemoptysis EXAM:  CHEST - 2 VIEW COMPARISON:  None. FINDINGS: The heart size and mediastinal contours are within normal limits. Both lungs are clear. The visualized skeletal structures are unremarkable. IMPRESSION: No active cardiopulmonary disease. Electronically Signed   By: Inez Catalina M.D.   On: 08/21/2019 13:21    Procedures Procedures (including critical care time)  Medications Ordered in ED Medications - No data to display  ED Course  I have reviewed the triage vital signs and the nursing notes.  Pertinent labs & imaging results that were available during my care of the patient were reviewed by me and considered in my medical decision making (see chart for details).    MDM Rules/Calculators/A&P                      Meril Brockway is a 43 y.o. female with a past medical history significant for hypertension, hysterectomy, anxiety, depression, and prior GI bleeding who presents at the direction of her PCP for further work-up and evaluation of nausea, vomiting, and possible anemia.  Patient reports that she has had years of occasional rectal bleeding but over the last few weeks has had more nausea, vomiting, and vomiting blood in the mornings.  She reports she is really very fatigued and tired.  She describes no chest pain shortness of breath or syncopal episodes.  She denies any palpitations.  She denies any new cough and abdominal pain.  She occasionally reports discomfort when vomiting.  She says that she has been having some nosebleeds which she is never had before and some bleeding in her mouth.  She reports some occasional vaginal  spotting that has resolved.  She reports that she went to her PCP who did blood work and gave her a list of things she needs to get evaluated in the emergency department.  She had paperwork showing a request to get electrolytes evaluated, kidney function evaluated, sodium evaluated, calcium, and parathyroid hormone.  It also discussed possible CT scan.  On my exam, patient is resting comfortably.  She did not have any active bleeding on my exam.  Lungs were clear and chest was nontender.  No murmur.  Normal bowel sounds.  I did not see evidence of bleeding in her mouth or any oropharyngeal abnormality in her throat.  No epistaxis seen.  No focal neurologic deficits.  Patient resting comfortably with reassuring vital signs aside from hypertension.  Had a shared decision made conversation with patient we agreed to get some work-up completed.  Due to the concern for rectal bleeding and vomiting blood, she had a type and screen sent as well as some electrolytes and blood counts.  She will have urinalysis and other labs to work-up her fatigue.  Will hold on CT scan given her lack of any respiratory or cardiac symptoms at this time.  We will send off the parathyroid hormone for the PCP to interpret.  Given the request of the PCP I am concerned that she may have a malignancy or other cancerous cause of her reported 60 pound weight loss over the last year with the symptoms.  Fortunately, work-up returned reassuring.  She was not anemic.  Her labs were not consistent with a Hematologic malignancy.  She had no leukocytosis or anemia.  Normal platelets.  INR 1.0.  Other than slightly decreased CO2, other labs look good.  Creatinine was not elevated.  Urinalysis does not show infection.  Thyroid normal.  Chest x-ray reassuring.  Had a long shared decision made conversation  with patient and family and we feel she safe for discharge home to continue outpatient work-up with her PCP.  With the nausea, vomiting, and vomiting  blood, she may have an upper GI bleed or ulcer however we do not feel she is admission at this time.  We will start the patient on Prilosec and give prescription for nausea medication.  She will follow-up with her PCP and she reports is already scheduled see GI coming up.  Patient is return precautions and follow-up instructions.  Patient discharged in good condition with no other complaints.    Final Clinical Impression(s) / ED Diagnoses Final diagnoses:  Non-intractable vomiting with nausea, unspecified vomiting type  Fatigue, unspecified type    Rx / DC Orders ED Discharge Orders    None      Clinical Impression: 1. Non-intractable vomiting with nausea, unspecified vomiting type   2. Fatigue, unspecified type     Disposition: Discharge  Condition: Good  I have discussed the results, Dx and Tx plan with the pt(& family if present). He/she/they expressed understanding and agree(s) with the plan. Discharge instructions discussed at great length. Strict return precautions discussed and pt &/or family have verbalized understanding of the instructions. No further questions at time of discharge.    New Prescriptions   OMEPRAZOLE (PRILOSEC) 20 MG CAPSULE    Take 1 capsule (20 mg total) by mouth daily.   ONDANSETRON (ZOFRAN) 4 MG TABLET    Take 1 tablet (4 mg total) by mouth every 8 (eight) hours as needed.    Follow Up: [facility] [phone] Schedule an appointment as soon as possible for a visit    [place] [address] [id number] [place]       Taeja Debellis, Gwenyth Allegra, MD 08/21/19 2121

## 2019-08-22 LAB — URINE CULTURE: Culture: NO GROWTH

## 2019-12-20 ENCOUNTER — Encounter (HOSPITAL_COMMUNITY): Payer: Self-pay

## 2019-12-20 ENCOUNTER — Ambulatory Visit (HOSPITAL_COMMUNITY)
Admission: EM | Admit: 2019-12-20 | Discharge: 2019-12-20 | Disposition: A | Discharge status: Home / Self Care | Payer: Medicaid Other | Attending: Family Medicine | Admitting: Family Medicine

## 2019-12-20 ENCOUNTER — Other Ambulatory Visit: Payer: Self-pay

## 2019-12-20 DIAGNOSIS — N390 Urinary tract infection, site not specified: Secondary | ICD-10-CM

## 2019-12-20 DIAGNOSIS — Z20822 Contact with and (suspected) exposure to covid-19: Secondary | ICD-10-CM | POA: Insufficient documentation

## 2019-12-20 LAB — SARS CORONAVIRUS 2 (TAT 6-24 HRS): SARS Coronavirus 2: NEGATIVE

## 2019-12-20 LAB — POCT URINALYSIS DIP (DEVICE)
Glucose, UA: NEGATIVE mg/dL
Ketones, ur: 40 mg/dL — AB
Nitrite: NEGATIVE
Protein, ur: 100 mg/dL — AB
Specific Gravity, Urine: 1.02 (ref 1.005–1.030)
Urobilinogen, UA: 0.2 mg/dL (ref 0.0–1.0)
pH: 5.5 (ref 5.0–8.0)

## 2019-12-20 MED ORDER — FLUTICASONE PROPIONATE 50 MCG/ACT NA SUSP: 1.0000 | Freq: Every day | NASAL | 0 refills | Status: DC

## 2019-12-20 MED ORDER — ONDANSETRON 4 MG PO TBDP: 4.0000 mg | ORAL_TABLET | Freq: Three times a day (TID) | ORAL | 0 refills | Status: DC | PRN

## 2019-12-20 MED ORDER — BENZONATATE 200 MG PO CAPS: 200.0000 mg | ORAL_CAPSULE | Freq: Three times a day (TID) | ORAL | 0 refills | Status: AC | PRN

## 2019-12-20 MED ORDER — CEPHALEXIN 500 MG PO CAPS: 500.0000 mg | ORAL_CAPSULE | Freq: Two times a day (BID) | ORAL | 0 refills | Status: AC

## 2019-12-20 NOTE — ED Triage Notes (Signed)
Patient reports body aches, chills, n/v, cough, fever, and loss of smell starting Wednesday. Reports her daughter recently tested positive for COVID.

## 2019-12-20 NOTE — Discharge Instructions (Addendum)
Covid test pending, monitor my chart for results Please use Tessalon/benzonatate every 8 hours as needed for cough Flonase nasal spray 1 to 2 spray in each nostril daily to help with sinus congestion Zofran as needed for nausea/vomiting Focus on drinking plenty of fluids Please follow-up if any symptoms not improving or worsening, developing difficulty breathing, shortness of breath, chest pain, persistent fevers or abdominal pain  Urine showed evidence of infection. We are treating you with keflex- twice daily for 1 week. Be sure to take full course. Stay hydrated- urine should be pale yellow to clear.   Please return or follow up with your primary provider if symptoms not improving with treatment. Please return sooner if you have worsening of symptoms or develop fever, nausea, vomiting, abdominal pain, back pain, lightheadedness, dizziness.

## 2019-12-21 NOTE — ED Provider Notes (Signed)
[place]    CSN: [id number] Arrival date & time: 12/20/19  1057      History   Chief Complaint Chief Complaint  Patient presents with  . Generalized Body Aches  . Chills  . Cough  . loss of taste  . Fever  . Emesis    HPI Julie Roberson is a 44 y.o. female history of hypertension presenting today for evaluation of body aches chills and Covid exposure.  Patient notes that since Wednesday over the past 45 days she has had some congestion cough, body aches, fever as well as loss of taste and smell.  She also reports some nausea and vomiting.  Reports initially she was having symptoms concerning for possible UTI including incomplete voiding and urinary frequency, but states the symptoms have improved slightly from drinking plenty of water and using cranberry juice.  Her daughter has Covid at home.  She denies chest pain or shortness of breath.  HPI  Past Medical History:  Diagnosis Date  . Hypertension     Patient Active Problem List   Diagnosis Date Noted  . Anxiety and depression   . Status post hysterectomy 05/18/2015  . Fibroid uterus 02/17/2015  . Menorrhagia with regular cycle 02/17/2015  . Acute blood loss anemia 02/17/2015  . Weight loss 02/17/2015    Past Surgical History:  Procedure Laterality Date  . ABDOMINAL HYSTERECTOMY Bilateral 05/18/2015   Procedure: HYSTERECTOMY ABDOMINAL BILATERAL SALPINGECTOMY;  Surgeon: Donnamae Jude, MD;  Location: [facility];  Service: Gynecology;  Laterality: Bilateral;  . CESAREAN SECTION      OB History    Gravida  10   Para  2   Term      Preterm  2   AB  8   Living  2     SAB  1   TAB  7   Ectopic      Multiple      Live Births               Home Medications    Prior to Admission medications   Medication Sig Start Date End Date Taking? Authorizing Provider  benzonatate (TESSALON) 200 MG capsule Take 1 capsule (200 mg total) by mouth 3 (three) times daily as needed for up to 7 days for  cough. 12/20/19 12/27/19  Dayonna Selbe C, PA-C  cephALEXin (KEFLEX) 500 MG capsule Take 1 capsule (500 mg total) by mouth 2 (two) times daily for 7 days. 12/20/19 12/27/19  Elsworth Ledin C, PA-C  fluticasone (FLONASE) 50 MCG/ACT nasal spray Place 1-2 sprays into both nostrils daily for 7 days. 12/20/19 12/27/19  Juanitta Earnhardt C, PA-C  omeprazole (PRILOSEC) 20 MG capsule Take 1 capsule (20 mg total) by mouth daily. 08/21/19   Tegeler, Gwenyth Allegra, MD  ondansetron (ZOFRAN ODT) 4 MG disintegrating tablet Take 1 tablet (4 mg total) by mouth every 8 (eight) hours as needed for nausea or vomiting. 12/20/19   Keylon Labelle C, PA-C  ondansetron (ZOFRAN) 4 MG tablet Take 1 tablet (4 mg total) by mouth every 8 (eight) hours as needed. 08/21/19   Tegeler, Gwenyth Allegra, MD    Family History History reviewed. No pertinent family history.  Social History Social History   Tobacco Use  . Smoking status: Current Every Day Smoker    Packs/day: 0.25    Types: Cigarettes  . Smokeless tobacco: Never Used  Substance Use Topics  . Alcohol use: Yes    Alcohol/week: 7.0 standard drinks  Types: 7 Standard drinks or equivalent per week    Comment: 40 oz of alcohol per day  . Drug use: No     Allergies   Patient has no known allergies.   Review of Systems Review of Systems  Constitutional: Positive for chills and fatigue. Negative for activity change, appetite change and fever.  HENT: Positive for congestion, rhinorrhea and sore throat. Negative for ear pain, sinus pressure and trouble swallowing.   Eyes: Negative for discharge and redness.  Respiratory: Positive for cough. Negative for chest tightness and shortness of breath.   Cardiovascular: Negative for chest pain.  Gastrointestinal: Positive for nausea and vomiting. Negative for abdominal pain and diarrhea.  Musculoskeletal: Positive for back pain and myalgias.  Skin: Negative for rash.  Neurological: Positive for headaches. Negative for dizziness  and light-headedness.     Physical Exam Triage Vital Signs ED Triage Vitals [12/20/19 1130]  Enc Vitals Group     BP (!) 174/100     Pulse Rate (!) 101     Resp 14     Temp 99.5 F (37.5 C)     Temp Source Oral     SpO2 100 %     Weight      Height      Head Circumference      Peak Flow      Pain Score 7     Pain Loc      Pain Edu?      Excl. in [place]?    No data found.  Updated Vital Signs BP (!) 174/100 (BP Location: Left Arm)   Pulse (!) 101   Temp 99.5 F (37.5 C) (Oral) Comment: ibuprofen at 9am  Resp 14   LMP 04/07/2015   SpO2 100%   Visual Acuity Right Eye Distance:   Left Eye Distance:   Bilateral Distance:    Right Eye Near:   Left Eye Near:    Bilateral Near:     Physical Exam Vitals and nursing note reviewed.  Constitutional:      Appearance: She is well-developed.     Comments: No acute distress  HENT:     Head: Normocephalic and atraumatic.     Ears:     Comments: Bilateral ears without tenderness to palpation of external auricle, tragus and mastoid, EAC's without erythema or swelling, TM's with good bony landmarks and cone of light. Non erythematous.     Nose: Nose normal.     Comments: Nasal mucosa erythematous, swollen turbinates bilaterally    Mouth/Throat:     Comments: Oral mucosa pink and moist, no tonsillar enlargement or exudate. Posterior pharynx patent and nonerythematous, no uvula deviation or swelling. Normal phonation.  Eyes:     Conjunctiva/sclera: Conjunctivae normal.  Cardiovascular:     Rate and Rhythm: Normal rate.  Pulmonary:     Effort: Pulmonary effort is normal. No respiratory distress.     Comments: Breathing comfortably at rest, CTABL, no wheezing, rales or other adventitious sounds auscultated Abdominal:     General: There is no distension.     Comments: Soft, nondistended, tender to palpation to bilateral lower abdomen and suprapubic area  Musculoskeletal:        General: Normal range of motion.     Cervical  back: Neck supple.  Skin:    General: Skin is warm and dry.  Neurological:     Mental Status: She is alert and oriented to person, place, and time.      UC Treatments /  Results  Labs (all labs ordered are listed, but only abnormal results are displayed) Labs Reviewed  POCT URINALYSIS DIP (DEVICE) - Abnormal; Notable for the following components:      Result Value   Bilirubin Urine SMALL (*)    Ketones, ur 40 (*)    Hgb urine dipstick SMALL (*)    Protein, ur 100 (*)    Leukocytes,Ua LARGE (*)    All other components within normal limits  SARS CORONAVIRUS 2 (TAT 6-24 HRS)  URINE CULTURE    EKG   Radiology No results found.  Procedures Procedures (including critical care time)  Medications Ordered in UC Medications - No data to display  Initial Impression / Assessment and Plan / UC Course  I have reviewed the triage vital signs and the nursing notes.  Pertinent labs & imaging results that were available during my care of the patient were reviewed by me and considered in my medical decision making (see chart for details).     Covid PCR pending, highly suspicious of symptoms given loss of taste and smell and given close contact at home also positive.  Recommending symptomatic and supportive care of URI symptoms, Tessalon and Flonase, Tylenol and ibuprofen for body aches and any fevers.  UA also suggestive of UTI, culture pending.  Empirically treating with Keflex, cannot rule out possible pyelonephritis given associated symptoms of nausea vomiting fevers and chills, unclear if this is related to Covid versus Pyelo.  Treating x1 week with Keflex.  Will alter therapy based off culture results.  Discussed strict return precautions. Patient verbalized understanding and is agreeable with plan.  Final Clinical Impressions(s) / UC Diagnoses   Final diagnoses:  Contact with and (suspected) exposure to covid-19  Lower urinary tract infectious disease     Discharge  Instructions     Covid test pending, monitor my chart for results Please use Tessalon/benzonatate every 8 hours as needed for cough Flonase nasal spray 1 to 2 spray in each nostril daily to help with sinus congestion Zofran as needed for nausea/vomiting Focus on drinking plenty of fluids Please follow-up if any symptoms not improving or worsening, developing difficulty breathing, shortness of breath, chest pain, persistent fevers or abdominal pain  Urine showed evidence of infection. We are treating you with keflex- twice daily for 1 week. Be sure to take full course. Stay hydrated- urine should be pale yellow to clear.   Please return or follow up with your primary provider if symptoms not improving with treatment. Please return sooner if you have worsening of symptoms or develop fever, nausea, vomiting, abdominal pain, back pain, lightheadedness, dizziness.   ED Prescriptions    Medication Sig Dispense Auth. Provider   benzonatate (TESSALON) 200 MG capsule Take 1 capsule (200 mg total) by mouth 3 (three) times daily as needed for up to 7 days for cough. 28 capsule Catelyn Friel C, PA-C   ondansetron (ZOFRAN ODT) 4 MG disintegrating tablet Take 1 tablet (4 mg total) by mouth every 8 (eight) hours as needed for nausea or vomiting. 20 tablet Soniya Ashraf C, PA-C   fluticasone (FLONASE) 50 MCG/ACT nasal spray Place 1-2 sprays into both nostrils daily for 7 days. 1 g Johnathon Olden C, PA-C   cephALEXin (KEFLEX) 500 MG capsule Take 1 capsule (500 mg total) by mouth 2 (two) times daily for 7 days. 14 capsule Druscilla Petsch, [place] C, PA-C     PDMP not reviewed this encounter.   Janith Lima, [place] 12/21/19 [phone]

## 2019-12-22 LAB — URINE CULTURE: Culture: >=100000 — AB

## 2020-01-15 ENCOUNTER — Other Ambulatory Visit: Payer: Self-pay | Admitting: Internal Medicine

## 2020-01-15 DIAGNOSIS — Z1231 Encounter for screening mammogram for malignant neoplasm of breast: Secondary | ICD-10-CM

## 2020-04-22 ENCOUNTER — Encounter (HOSPITAL_COMMUNITY): Payer: Self-pay

## 2020-04-22 ENCOUNTER — Ambulatory Visit (HOSPITAL_COMMUNITY)
Admission: EM | Admit: 2020-04-22 | Discharge: 2020-04-22 | Disposition: A | Discharge status: Home / Self Care | Payer: Medicaid Other | Attending: Family Medicine | Admitting: Family Medicine

## 2020-04-22 ENCOUNTER — Other Ambulatory Visit: Payer: Self-pay

## 2020-04-22 DIAGNOSIS — Z20822 Contact with and (suspected) exposure to covid-19: Secondary | ICD-10-CM | POA: Insufficient documentation

## 2020-04-22 DIAGNOSIS — I10 Essential (primary) hypertension: Secondary | ICD-10-CM | POA: Diagnosis not present

## 2020-04-22 DIAGNOSIS — R030 Elevated blood-pressure reading, without diagnosis of hypertension: Secondary | ICD-10-CM

## 2020-04-22 DIAGNOSIS — R52 Pain, unspecified: Secondary | ICD-10-CM | POA: Insufficient documentation

## 2020-04-22 DIAGNOSIS — J069 Acute upper respiratory infection, unspecified: Secondary | ICD-10-CM | POA: Diagnosis not present

## 2020-04-22 MED ORDER — AMLODIPINE BESYLATE 5 MG PO TABS: 5.0000 mg | ORAL_TABLET | Freq: Every day | ORAL | 0 refills | Status: DC

## 2020-04-22 MED ORDER — BENZONATATE 100 MG PO CAPS: 100.0000 mg | ORAL_CAPSULE | Freq: Three times a day (TID) | ORAL | 0 refills | Status: DC | PRN

## 2020-04-22 NOTE — Discharge Instructions (Addendum)
We will notify you of your COVID-19 test results as they arrive and may take between 24 to 48 hours.  I encourage you to sign up for MyChart if you have not already done so as this can be the easiest way for [place] to communicate results to you online or through a phone app.  In the meantime, if you develop worsening symptoms including fever, chest pain, shortness of breath despite our current treatment plan then please report to the emergency room as this may be a sign of worsening status from possible COVID-19 infection.  Otherwise, we will manage this as a viral syndrome. For sore throat or cough try using a honey-based tea. Use 3 teaspoons of honey with juice squeezed from half lemon. Place shaved pieces of ginger into 1/2-1 cup of water and warm over stove top. Then mix the ingredients and repeat every 4 hours as needed. Please take Tylenol 500mg -650mg  every 6 hours for aches and pains, fevers. Hydrate very well with at least 2 liters of water. Eat light meals such as soups to replenish electrolytes and soft fruits, veggies. Start an antihistamine like Zyrtec, Allegra or Claritin for postnasal drainage, sinus congestion.    For diabetes or elevated blood sugar, please make sure you are avoiding starchy, carbohydrate foods like pasta, breads, pastry, rice, potatoes, desserts. These foods can elevated your blood sugar. Also, avoid sodas, sweet teas, sugary beverages, fruit juices.  Drinking plain water will be much more helpful, try 64 ounces of water daily.  It is okay to flavor your water naturally by cutting cucumber, lemon, mint or lime, placing it in a picture with water and drinking it over a period of 2 to 3 days as long as it remains refrigerated.    For elevated blood pressure, make sure you are monitoring salt in your diet.  Do not eat restaurant foods and limit processed foods at home, prepare/cook your own foods at home.  Processed foods include things like frozen meals preseasoned meats and  dinners, deli meats, canned foods as they are high in sodium/salt.  Make sure your pain attention to sodium labels on foods you by at the grocery store.  For seasoning you can use a brand called Mrs. Dash which includes a lot of salt free seasonings.  Salads - kale, spinach, cabbage, spring mix; use seeds like pumpkin seeds or sunflower seeds, almonds, walnuts or pecans; you can also use 1-2 hard boiled eggs in your salads Fruits - avocadoes, berries (blueberries, raspberries, blackberries), apples, oranges, pomegranate, pear; avoid eating bananas, grapes regularly Vegetables - aspargus, cauliflower, broccoli, green beans, brussel spouts, bell peppers; stay away from starchy vegetables like potatoes, carrots, peas  Regarding meat it is better to eat lean meats and limit your red meat including pork to once a week.  Wild caught fish, chicken breast are good options as they tend to be leaner sources of good protein.   DO NOT EAT ANY FOODS ON THIS LIST THAT YOU ARE ALLERGIC TO.

## 2020-04-22 NOTE — ED Provider Notes (Signed)
[place]   MRN: [id number] DOB: 28-Apr-1976  Subjective:   Julie Roberson is a 44 y.o. female presenting for 2-day history of acute onset body aches, cough.  Patient would like to make sure that she gets tested for COVID-19.  Denies having had Covid vaccination.  States that she knows she has high blood pressure, is not taking any medication for it.  States that she does not have insurance and does not have a primary care provider.  Denies severe headache, confusion, weakness, chest pain, belly pain, hematuria, lower leg swelling.  No current facility-administered medications for this encounter.  Current Outpatient Medications:  .  fluticasone (FLONASE) 50 MCG/ACT nasal spray, Place 1-2 sprays into both nostrils daily for 7 days., Disp: 1 g, Rfl: 0 .  omeprazole (PRILOSEC) 20 MG capsule, Take 1 capsule (20 mg total) by mouth daily., Disp: 30 capsule, Rfl: 0 .  ondansetron (ZOFRAN ODT) 4 MG disintegrating tablet, Take 1 tablet (4 mg total) by mouth every 8 (eight) hours as needed for nausea or vomiting., Disp: 20 tablet, Rfl: 0 .  ondansetron (ZOFRAN) 4 MG tablet, Take 1 tablet (4 mg total) by mouth every 8 (eight) hours as needed., Disp: 12 tablet, Rfl: 0   No Known Allergies  Past Medical History:  Diagnosis Date  . Hypertension      Past Surgical History:  Procedure Laterality Date  . ABDOMINAL HYSTERECTOMY Bilateral 05/18/2015   Procedure: HYSTERECTOMY ABDOMINAL BILATERAL SALPINGECTOMY;  Surgeon: Donnamae Jude, MD;  Location: [facility];  Service: Gynecology;  Laterality: Bilateral;  . CESAREAN SECTION      Family History  Problem Relation Age of Onset  . Heart failure Mother   . Healthy Father     Social History   Tobacco Use  . Smoking status: Current Every Day Smoker    Packs/day: 0.25    Types: Cigarettes  . Smokeless tobacco: Never Used  Substance Use Topics  . Alcohol use: Yes    Alcohol/week: 7.0 standard drinks    Types: 7 Standard drinks or equivalent  per week    Comment: 40 oz of alcohol per day  . Drug use: No    ROS   Objective:   Vitals: BP (!) 196/110   Pulse 91   Temp 98.4 F (36.9 C)   Resp 19   LMP 04/07/2015   SpO2 100%   BP Readings from Last 3 Encounters:  04/22/20 (!) 196/110  12/20/19 (!) 174/100  08/21/19 (!) 178/110   Physical Exam Constitutional:      General: She is not in acute distress.    Appearance: Normal appearance. She is well-developed. She is not ill-appearing, toxic-appearing or diaphoretic.  HENT:     Head: Normocephalic and atraumatic.     Nose: Nose normal.     Mouth/Throat:     Mouth: Mucous membranes are moist.  Eyes:     Extraocular Movements: Extraocular movements intact.     Pupils: Pupils are equal, round, and reactive to light.  Cardiovascular:     Rate and Rhythm: Normal rate and regular rhythm.     Pulses: Normal pulses.     Heart sounds: Normal heart sounds. No murmur heard.  No friction rub. No gallop.   Pulmonary:     Effort: Pulmonary effort is normal. No respiratory distress.     Breath sounds: Normal breath sounds. No stridor. No wheezing, rhonchi or rales.  Skin:    General: Skin is warm and dry.  Findings: No rash.  Neurological:     Mental Status: She is alert and oriented to person, place, and time.     Cranial Nerves: No cranial nerve deficit.     Motor: No weakness.     Coordination: Coordination normal.     Gait: Gait normal.     Deep Tendon Reflexes: Reflexes normal.     Comments: Negative Romberg and pronator drift.  Psychiatric:        Mood and Affect: Mood normal.        Behavior: Behavior normal.        Thought Content: Thought content normal.        Judgment: Judgment normal.      Assessment and Plan :   PDMP not reviewed this encounter.  1. Viral URI with cough   2. Body aches   3. Essential hypertension   4. Elevated blood pressure reading     Had extensive discussion with patient about risks of uncontrolled hypertension.   Recommended she start amlodipine, hypertensive friendly diet. Will manage for viral illness such as viral URI, viral syndrome, viral rhinitis, COVID-19. Counseled patient on nature of COVID-19 including modes of transmission, diagnostic testing, management and supportive care.  Offered scripts for symptomatic relief. COVID 19 testing is pending. Counseled patient on potential for adverse effects with medications prescribed/recommended today, ER and return-to-clinic precautions discussed, patient verbalized understanding.     Jaynee Eagles, [place] 04/22/20 1858

## 2020-04-22 NOTE — ED Triage Notes (Signed)
Pt presents with complaints of cough and body aches x 2 days. Concerned for COVID. Pt states she has not been vaccinated.

## 2020-04-23 LAB — SARS CORONAVIRUS 2 (TAT 6-24 HRS): SARS Coronavirus 2: NEGATIVE

## 2021-06-26 IMAGING — DX DG CHEST 2V
2 series · 2 of 2 positions shown · non-contrast
Comparison: None.

CLINICAL DATA: Fatigue and hemoptysis

EXAM:
CHEST - 2 VIEW

[chest pa]
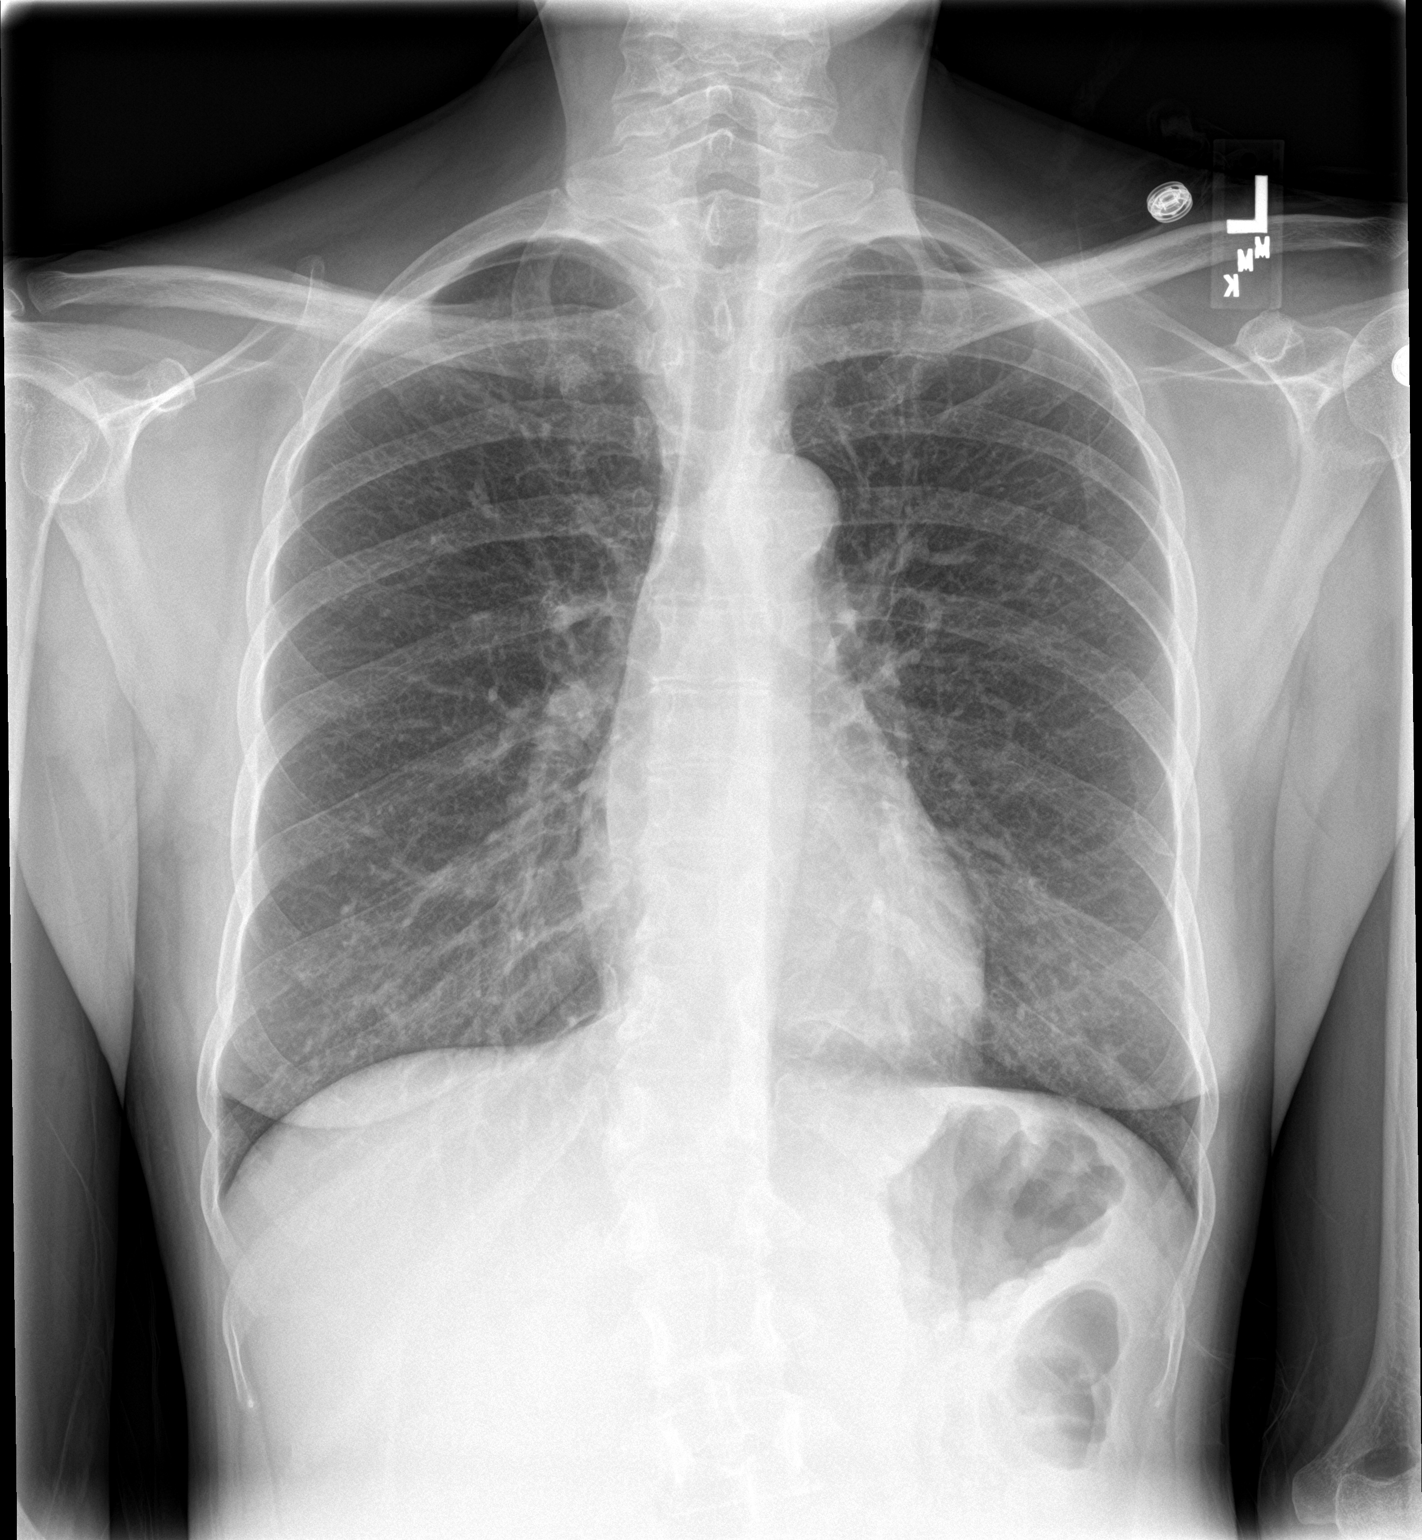

[chest lat]
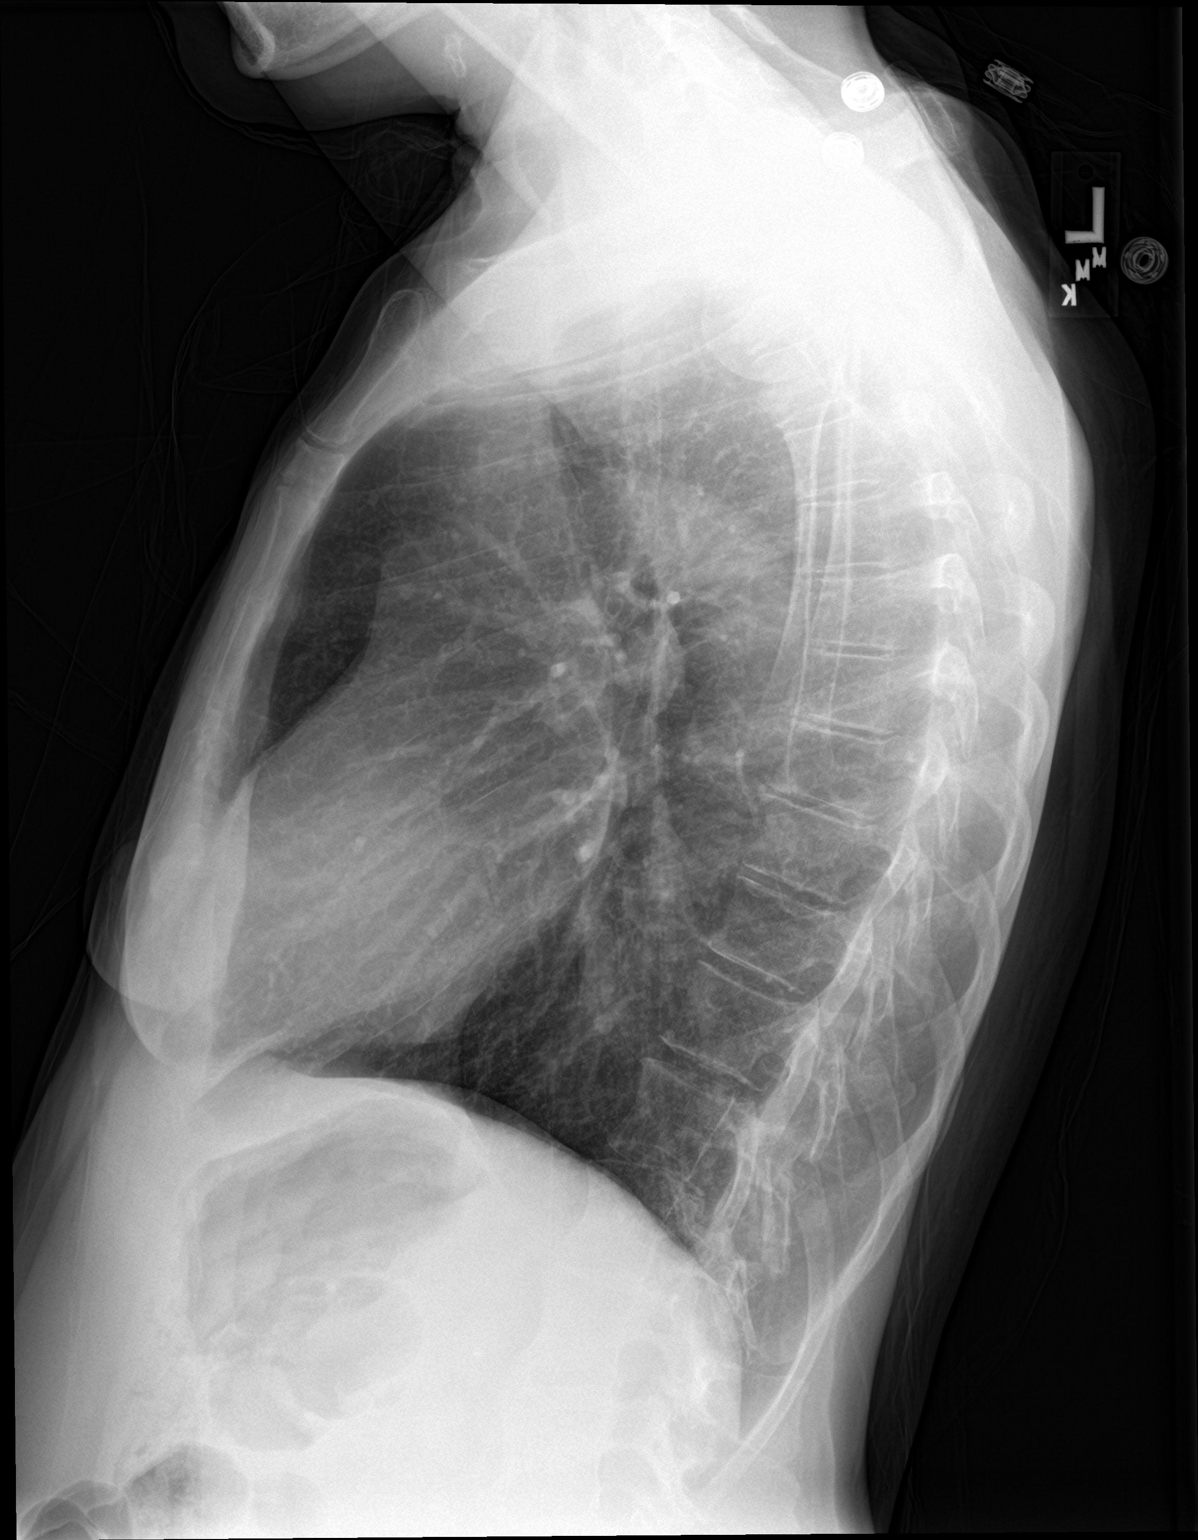

[2 of 2 positions shown; findings below may reference images not displayed]

FINDINGS: The heart size and mediastinal contours are within normal limits.
Both lungs are clear. The visualized skeletal structures are
unremarkable.
IMPRESSION: No active cardiopulmonary disease.

## 2021-11-10 ENCOUNTER — Other Ambulatory Visit: Payer: Self-pay | Admitting: Family Medicine

## 2021-11-10 DIAGNOSIS — Z803 Family history of malignant neoplasm of breast: Secondary | ICD-10-CM

## 2021-11-10 DIAGNOSIS — N6320 Unspecified lump in the left breast, unspecified quadrant: Secondary | ICD-10-CM

## 2021-11-10 DIAGNOSIS — R634 Abnormal weight loss: Secondary | ICD-10-CM

## 2022-11-28 ENCOUNTER — Other Ambulatory Visit: Payer: Self-pay

## 2022-11-28 ENCOUNTER — Encounter (HOSPITAL_COMMUNITY): Payer: Self-pay | Admitting: Emergency Medicine

## 2022-11-28 ENCOUNTER — Inpatient Hospital Stay (HOSPITAL_COMMUNITY)
Admission: EM | Admit: 2022-11-28 | Discharge: 2022-12-01 | DRG: 439 | Disposition: A | Discharge status: Home / Self Care | Payer: Medicaid Other | Attending: Family Medicine | Admitting: Family Medicine

## 2022-11-28 DIAGNOSIS — K8681 Exocrine pancreatic insufficiency: Secondary | ICD-10-CM | POA: Diagnosis present

## 2022-11-28 DIAGNOSIS — K859 Acute pancreatitis without necrosis or infection, unspecified: Principal | ICD-10-CM | POA: Diagnosis present

## 2022-11-28 DIAGNOSIS — K760 Fatty (change of) liver, not elsewhere classified: Secondary | ICD-10-CM | POA: Diagnosis present

## 2022-11-28 DIAGNOSIS — F17210 Nicotine dependence, cigarettes, uncomplicated: Secondary | ICD-10-CM | POA: Diagnosis present

## 2022-11-28 DIAGNOSIS — Z90710 Acquired absence of both cervix and uterus: Secondary | ICD-10-CM

## 2022-11-28 DIAGNOSIS — F1090 Alcohol use, unspecified, uncomplicated: Secondary | ICD-10-CM | POA: Insufficient documentation

## 2022-11-28 DIAGNOSIS — Z8249 Family history of ischemic heart disease and other diseases of the circulatory system: Secondary | ICD-10-CM

## 2022-11-28 DIAGNOSIS — K2970 Gastritis, unspecified, without bleeding: Secondary | ICD-10-CM | POA: Insufficient documentation

## 2022-11-28 DIAGNOSIS — N390 Urinary tract infection, site not specified: Secondary | ICD-10-CM | POA: Insufficient documentation

## 2022-11-28 DIAGNOSIS — F1010 Alcohol abuse, uncomplicated: Secondary | ICD-10-CM | POA: Diagnosis present

## 2022-11-28 DIAGNOSIS — E876 Hypokalemia: Secondary | ICD-10-CM | POA: Diagnosis present

## 2022-11-28 DIAGNOSIS — Z79899 Other long term (current) drug therapy: Secondary | ICD-10-CM

## 2022-11-28 DIAGNOSIS — I10 Essential (primary) hypertension: Secondary | ICD-10-CM | POA: Insufficient documentation

## 2022-11-28 DIAGNOSIS — K2900 Acute gastritis without bleeding: Secondary | ICD-10-CM | POA: Diagnosis present

## 2022-11-28 DIAGNOSIS — K861 Other chronic pancreatitis: Secondary | ICD-10-CM | POA: Diagnosis present

## 2022-11-28 LAB — URINALYSIS, ROUTINE W REFLEX MICROSCOPIC
Glucose, UA: NEGATIVE mg/dL
Hgb urine dipstick: NEGATIVE
Ketones, ur: 80 mg/dL — AB
Leukocytes,Ua: NEGATIVE
Nitrite: POSITIVE — AB
Protein, ur: 100 mg/dL — AB
Specific Gravity, Urine: 1.021 (ref 1.005–1.030)
pH: 6 (ref 5.0–8.0)

## 2022-11-28 LAB — COMPREHENSIVE METABOLIC PANEL
ALT: 40 U/L (ref 0–44)
AST: 81 U/L — ABNORMAL HIGH (ref 15–41)
Albumin: 4.4 g/dL (ref 3.5–5.0)
Alkaline Phosphatase: 110 U/L (ref 38–126)
Anion gap: 13 (ref 5–15)
BUN: <5 mg/dL — ABNORMAL LOW (ref 6–20)
CO2: 21 mmol/L — ABNORMAL LOW (ref 22–32)
Calcium: 10.5 mg/dL — ABNORMAL HIGH (ref 8.9–10.3)
Chloride: 101 mmol/L (ref 98–111)
Creatinine, Ser: 0.52 mg/dL (ref 0.44–1.00)
GFR, Estimated: >60 mL/min (ref >60)
Glucose, Bld: 111 mg/dL — ABNORMAL HIGH (ref 70–99)
Potassium: 3.6 mmol/L (ref 3.5–5.1)
Sodium: 135 mmol/L (ref 135–145)
Total Bilirubin: 0.6 mg/dL (ref 0.3–1.2)
Total Protein: 7.9 g/dL (ref 6.5–8.1)

## 2022-11-28 LAB — CBC
HCT: 40.7 % (ref 36.0–46.0)
Hemoglobin: 14.2 g/dL (ref 12.0–15.0)
MCH: 37.3 pg — ABNORMAL HIGH (ref 26.0–34.0)
MCHC: 34.9 g/dL (ref 30.0–36.0)
MCV: 106.8 fL — ABNORMAL HIGH (ref 80.0–100.0)
Platelets: 201 10*3/uL (ref 150–400)
RBC: 3.81 MIL/uL — ABNORMAL LOW (ref 3.87–5.11)
RDW: 14.3 % (ref 11.5–15.5)
WBC: 12.1 10*3/uL — ABNORMAL HIGH (ref 4.0–10.5)
nRBC: 0.2 % (ref 0.0–0.2)

## 2022-11-28 LAB — LIPASE, BLOOD: Lipase: 551 U/L — ABNORMAL HIGH (ref 11–51)

## 2022-11-28 MED ORDER — MORPHINE SULFATE (PF) 4 MG/ML IV SOLN
4.0000 mg | Freq: Once | INTRAVENOUS | Status: AC
  Administered 2022-11-28: 4 mg via INTRAVENOUS
  Filled 2022-11-28: qty 1

## 2022-11-28 MED ORDER — LACTATED RINGERS IV SOLN: INTRAVENOUS | Status: DC

## 2022-11-28 MED ORDER — ONDANSETRON 4 MG PO TBDP: 4.0000 mg | ORAL_TABLET | Freq: Once | ORAL | Status: DC | PRN

## 2022-11-28 MED ORDER — ONDANSETRON HCL 4 MG/2ML IJ SOLN
4.0000 mg | Freq: Once | INTRAMUSCULAR | Status: AC
  Administered 2022-11-28: 4 mg via INTRAVENOUS
  Filled 2022-11-28: qty 2

## 2022-11-28 MED ORDER — LACTATED RINGERS IV BOLUS
1000.0000 mL | Freq: Once | INTRAVENOUS | Status: AC
  Administered 2022-11-28: 1000 mL via INTRAVENOUS

## 2022-11-28 NOTE — ED Provider Notes (Signed)
[place] EMERGENCY DEPARTMENT AT [facility] Provider Note   CSN: [id number] Arrival date & time: 11/28/22  1615     History {Add pertinent medical, surgical, social history, OB history to HPI:1} Chief Complaint  Patient presents with   Abdominal Pain    Julie Roberson is a 47 y.o. female.  Patient is a 47 year old female with a history of hypertension not currently being treated, daily alcohol use who is presenting today with complaints of abdominal pain that started around 530 this morning.  She reports she woke up had a bowel movement and has had abdominal pain ever since which is gradually worsening.  Throughout the day she had tried some ibuprofen but reports could not hold it down and vomited at least 5-6 times.  The pain is in her upper abdomen and worse if she tries to eat something.  She denies ever having pain like this before.  She does typically drink 2 40 ounce beers per day and reports that if she does not drink she feels a Bulman bit irritable but does not know if she goes through withdrawal.  She has had a complete hysterectomy so does not have menses and denies any urinary symptoms.  The history is provided by the patient.  Abdominal Pain      Home Medications Prior to Admission medications   Medication Sig Start Date End Date Taking? Authorizing Provider  amLODipine (NORVASC) 5 MG tablet Take 1 tablet (5 mg total) by mouth daily. 04/22/20   Wallis Bamberg, PA-C  benzonatate (TESSALON) 100 MG capsule Take 1-2 capsules (100-200 mg total) by mouth 3 (three) times daily as needed. 04/22/20   Wallis Bamberg, PA-C  fluticasone (FLONASE) 50 MCG/ACT nasal spray Place 1-2 sprays into both nostrils daily for 7 days. 12/20/19 12/27/19  Wieters, Hallie C, PA-C  omeprazole (PRILOSEC) 20 MG capsule Take 1 capsule (20 mg total) by mouth daily. 08/21/19   Tegeler, Canary Brim, MD  ondansetron (ZOFRAN ODT) 4 MG disintegrating tablet Take 1 tablet (4 mg total) by mouth every 8 (eight)  hours as needed for nausea or vomiting. 12/20/19   Wieters, Hallie C, PA-C  ondansetron (ZOFRAN) 4 MG tablet Take 1 tablet (4 mg total) by mouth every 8 (eight) hours as needed. 08/21/19   Tegeler, Canary Brim, MD      Allergies    Patient has no known allergies.    Review of Systems   Review of Systems  Gastrointestinal:  Positive for abdominal pain.    Physical Exam Updated Vital Signs BP (!) 230/102   Pulse 91   Temp 98.2 F (36.8 C) (Oral)   Resp (!) 24   Ht 5' 2.5" (1.588 m)   Wt 54.4 kg   LMP 04/07/2015   SpO2 99%   BMI 21.60 kg/m  Physical Exam Vitals and nursing note reviewed.  Constitutional:      General: She is not in acute distress.    Appearance: She is well-developed.  HENT:     Head: Normocephalic and atraumatic.     Mouth/Throat:     Mouth: Mucous membranes are dry.  Eyes:     Pupils: Pupils are equal, round, and reactive to light.  Cardiovascular:     Rate and Rhythm: Normal rate and regular rhythm.     Heart sounds: Normal heart sounds. No murmur heard.    No friction rub.  Pulmonary:     Effort: Pulmonary effort is normal.     Breath sounds: Normal breath sounds.  No wheezing or rales.  Abdominal:     General: Bowel sounds are normal. There is no distension.     Palpations: Abdomen is soft. There is fluid wave and hepatomegaly.     Tenderness: There is abdominal tenderness in the epigastric area, periumbilical area and left upper quadrant. There is no guarding or rebound.  Musculoskeletal:        General: No tenderness. Normal range of motion.     Comments: No edema  Skin:    General: Skin is warm and dry.     Findings: No rash.  Neurological:     Mental Status: She is alert and oriented to person, place, and time. Mental status is at baseline.     Cranial Nerves: No cranial nerve deficit.  Psychiatric:        Behavior: Behavior normal.     ED Results / Procedures / Treatments   Labs (all labs ordered are listed, but only abnormal  results are displayed) Labs Reviewed  LIPASE, BLOOD - Abnormal; Notable for the following components:      Result Value   Lipase 551 (*)    All other components within normal limits  COMPREHENSIVE METABOLIC PANEL - Abnormal; Notable for the following components:   CO2 21 (*)    Glucose, Bld 111 (*)    BUN <5 (*)    Calcium 10.5 (*)    AST 81 (*)    All other components within normal limits  CBC - Abnormal; Notable for the following components:   WBC 12.1 (*)    RBC 3.81 (*)    MCV 106.8 (*)    MCH 37.3 (*)    All other components within normal limits  URINALYSIS, ROUTINE W REFLEX MICROSCOPIC - Abnormal; Notable for the following components:   Color, Urine AMBER (*)    APPearance HAZY (*)    Bilirubin Urine SMALL (*)    Ketones, ur 80 (*)    Protein, ur 100 (*)    Nitrite POSITIVE (*)    Bacteria, UA FEW (*)    All other components within normal limits    EKG None  Radiology No results found.  Procedures Procedures  {Document cardiac monitor, telemetry assessment procedure when appropriate:1}  Medications Ordered in ED Medications  ondansetron (ZOFRAN-ODT) disintegrating tablet 4 mg (has no administration in time range)  ondansetron (ZOFRAN) injection 4 mg (has no administration in time range)  lactated ringers bolus 1,000 mL (has no administration in time range)  morphine (PF) 4 MG/ML injection 4 mg (has no administration in time range)  lactated ringers infusion (has no administration in time range)    ED Course/ Medical Decision Making/ A&P   {   Click here for ABCD2, HEART and other calculatorsREFRESH Note before signing :1}                          Medical Decision Making Amount and/or Complexity of Data Reviewed Labs: ordered. Radiology: ordered.  Risk Prescription drug management.   Pt with multiple medical problems and comorbidities and presenting today with a complaint that caries a high risk for morbidity and mortality.  Here today with  complaint of abdominal pain and vomiting.  Patient has mostly upper abdominal symptoms and concern for cholecystitis versus pancreatitis versus alcoholic gastritis.  Patient has been having bowel movements regularly and low suspicion for obstruction at this time.  She has no lower abdominal symptoms concerning for appendicitis or diverticulitis.  Patient  has had a complete hysterectomy so cannot have torsion or pregnancy.  She denies any urinary symptoms and lower suspicion for renal stones at this time.  Have independently interpreted patient's labs today and UA shows 80 ketones, positive nitrites but otherwise few bacteria and 0 white cells with low suspicion of UTI, lipase today elevated at 551, CMP with normal creatinine, mild elevated AST of 81 but normal ALT and total bilirubin and minimal leukocytosis of 12.1 with normal hemoglobin.  Findings concerning for pancreatitis.  CT pending.  Patient given IV fluids, nausea and pain control.  Patient is noted to be hypertensive here in the 200s over 100s but she reports she has not been on any medication for a while because she never followed up with the doctor.   {Document critical care time when appropriate:1} {Document review of labs and clinical decision tools ie heart score, Chads2Vasc2 etc:1}  {Document your independent review of radiology images, and any outside records:1} {Document your discussion with family members, caretakers, and with consultants:1} {Document social determinants of health affecting pt's care:1} {Document your decision making why or why not admission, treatments were needed:1} Final Clinical Impression(s) / ED Diagnoses Final diagnoses:  None    Rx / DC Orders ED Discharge Orders     None

## 2022-11-28 NOTE — ED Triage Notes (Signed)
Patient arrives in wheelchair by POV with family c/o mid abdominal pain onset of 5:30 am this morning right after having a BM. Also having nausea. Patient states she is supposed to be on meds for BP but does not see a PCP anymore.

## 2022-11-29 ENCOUNTER — Emergency Department (HOSPITAL_COMMUNITY): Payer: Medicaid Other

## 2022-11-29 DIAGNOSIS — K861 Other chronic pancreatitis: Secondary | ICD-10-CM | POA: Diagnosis not present

## 2022-11-29 DIAGNOSIS — K2900 Acute gastritis without bleeding: Secondary | ICD-10-CM | POA: Diagnosis not present

## 2022-11-29 DIAGNOSIS — K6389 Other specified diseases of intestine: Secondary | ICD-10-CM | POA: Diagnosis not present

## 2022-11-29 DIAGNOSIS — K859 Acute pancreatitis without necrosis or infection, unspecified: Secondary | ICD-10-CM | POA: Diagnosis present

## 2022-11-29 DIAGNOSIS — N390 Urinary tract infection, site not specified: Secondary | ICD-10-CM | POA: Diagnosis not present

## 2022-11-29 DIAGNOSIS — K8681 Exocrine pancreatic insufficiency: Secondary | ICD-10-CM | POA: Diagnosis not present

## 2022-11-29 DIAGNOSIS — F1010 Alcohol abuse, uncomplicated: Secondary | ICD-10-CM | POA: Diagnosis not present

## 2022-11-29 DIAGNOSIS — Z90710 Acquired absence of both cervix and uterus: Secondary | ICD-10-CM | POA: Diagnosis not present

## 2022-11-29 DIAGNOSIS — K760 Fatty (change of) liver, not elsewhere classified: Secondary | ICD-10-CM | POA: Diagnosis not present

## 2022-11-29 DIAGNOSIS — Z8249 Family history of ischemic heart disease and other diseases of the circulatory system: Secondary | ICD-10-CM | POA: Diagnosis not present

## 2022-11-29 DIAGNOSIS — I10 Essential (primary) hypertension: Secondary | ICD-10-CM | POA: Diagnosis not present

## 2022-11-29 DIAGNOSIS — Z79899 Other long term (current) drug therapy: Secondary | ICD-10-CM | POA: Diagnosis not present

## 2022-11-29 DIAGNOSIS — E876 Hypokalemia: Secondary | ICD-10-CM | POA: Diagnosis not present

## 2022-11-29 DIAGNOSIS — F17210 Nicotine dependence, cigarettes, uncomplicated: Secondary | ICD-10-CM | POA: Diagnosis not present

## 2022-11-29 LAB — CBC
HCT: 35.1 % — ABNORMAL LOW (ref 36.0–46.0)
Hemoglobin: 12.5 g/dL (ref 12.0–15.0)
MCH: 37.5 pg — ABNORMAL HIGH (ref 26.0–34.0)
MCHC: 35.6 g/dL (ref 30.0–36.0)
MCV: 105.4 fL — ABNORMAL HIGH (ref 80.0–100.0)
Platelets: 169 10*3/uL (ref 150–400)
RBC: 3.33 MIL/uL — ABNORMAL LOW (ref 3.87–5.11)
RDW: 14.1 % (ref 11.5–15.5)
WBC: 10.1 10*3/uL (ref 4.0–10.5)
nRBC: 0 % (ref 0.0–0.2)

## 2022-11-29 LAB — URINALYSIS, W/ REFLEX TO CULTURE (INFECTION SUSPECTED)
Bilirubin Urine: NEGATIVE
Glucose, UA: NEGATIVE mg/dL
Ketones, ur: 20 mg/dL — AB
Leukocytes,Ua: NEGATIVE
Nitrite: POSITIVE — AB
Protein, ur: 30 mg/dL — AB
Specific Gravity, Urine: 1.029 (ref 1.005–1.030)
pH: 5 (ref 5.0–8.0)

## 2022-11-29 LAB — CREATININE, SERUM
Creatinine, Ser: 0.51 mg/dL (ref 0.44–1.00)
GFR, Estimated: >60 mL/min (ref >60)

## 2022-11-29 LAB — HIV ANTIBODY (ROUTINE TESTING W REFLEX): HIV Screen 4th Generation wRfx: NONREACTIVE

## 2022-11-29 MED ORDER — ONDANSETRON HCL 4 MG/2ML IJ SOLN: 4.0000 mg | Freq: Four times a day (QID) | INTRAMUSCULAR | Status: DC | PRN

## 2022-11-29 MED ORDER — SODIUM CHLORIDE 0.9% FLUSH
3.0000 mL | Freq: Two times a day (BID) | INTRAVENOUS | Status: DC
  Administered 2022-11-29 – 2022-12-01 (×4): 3 mL via INTRAVENOUS

## 2022-11-29 MED ORDER — FOLIC ACID 1 MG PO TABS
1.0000 mg | ORAL_TABLET | Freq: Every day | ORAL | Status: DC
  Administered 2022-11-29 – 2022-12-01 (×3): 1 mg via ORAL
  Filled 2022-11-29 (×3): qty 1

## 2022-11-29 MED ORDER — IOHEXOL 350 MG/ML SOLN
75.0000 mL | Freq: Once | INTRAVENOUS | Status: AC | PRN
  Administered 2022-11-29: 75 mL via INTRAVENOUS

## 2022-11-29 MED ORDER — ENOXAPARIN SODIUM 40 MG/0.4ML IJ SOSY
40.0000 mg | PREFILLED_SYRINGE | Freq: Every day | INTRAMUSCULAR | Status: DC
  Administered 2022-11-29: 40 mg via SUBCUTANEOUS
  Filled 2022-11-29: qty 0.4

## 2022-11-29 MED ORDER — LORAZEPAM 2 MG/ML IJ SOLN
0.0000 mg | Freq: Four times a day (QID) | INTRAMUSCULAR | Status: DC
  Administered 2022-11-29 – 2022-11-30 (×2): 1 mg via INTRAVENOUS
  Filled 2022-11-29 (×3): qty 1

## 2022-11-29 MED ORDER — ONDANSETRON HCL 4 MG PO TABS: 4.0000 mg | ORAL_TABLET | Freq: Four times a day (QID) | ORAL | Status: DC | PRN

## 2022-11-29 MED ORDER — THIAMINE HCL 100 MG/ML IJ SOLN
100.0000 mg | Freq: Every day | INTRAMUSCULAR | Status: DC
  Filled 2022-11-29: qty 2

## 2022-11-29 MED ORDER — LORAZEPAM 2 MG/ML IJ SOLN
1.0000 mg | INTRAMUSCULAR | Status: DC | PRN
  Administered 2022-11-29: 1 mg via INTRAVENOUS

## 2022-11-29 MED ORDER — SODIUM CHLORIDE 0.9 % IV SOLN
1.0000 g | INTRAVENOUS | Status: DC
  Administered 2022-11-29 – 2022-12-01 (×3): 1 g via INTRAVENOUS
  Filled 2022-11-29 (×5): qty 10

## 2022-11-29 MED ORDER — THIAMINE MONONITRATE 100 MG PO TABS
100.0000 mg | ORAL_TABLET | Freq: Every day | ORAL | Status: DC
  Administered 2022-11-29 – 2022-12-01 (×3): 100 mg via ORAL
  Filled 2022-11-29 (×3): qty 1

## 2022-11-29 MED ORDER — CLONIDINE HCL 0.2 MG/24HR TD PTWK
0.2000 mg | MEDICATED_PATCH | TRANSDERMAL | Status: DC
  Administered 2022-11-29: 0.2 mg via TRANSDERMAL
  Filled 2022-11-29: qty 1

## 2022-11-29 MED ORDER — MORPHINE SULFATE (PF) 2 MG/ML IV SOLN
2.0000 mg | INTRAVENOUS | Status: DC | PRN
  Administered 2022-11-29 – 2022-11-30 (×2): 2 mg via INTRAVENOUS
  Filled 2022-11-29 (×2): qty 1

## 2022-11-29 MED ORDER — LORAZEPAM 2 MG/ML IJ SOLN: 0.0000 mg | Freq: Two times a day (BID) | INTRAMUSCULAR | Status: DC

## 2022-11-29 MED ORDER — ACETAMINOPHEN 650 MG RE SUPP: 650.0000 mg | Freq: Four times a day (QID) | RECTAL | Status: DC | PRN

## 2022-11-29 MED ORDER — ADULT MULTIVITAMIN W/MINERALS CH
1.0000 | ORAL_TABLET | Freq: Every day | ORAL | Status: DC
  Administered 2022-11-29 – 2022-12-01 (×3): 1 via ORAL
  Filled 2022-11-29 (×3): qty 1

## 2022-11-29 MED ORDER — LORAZEPAM 1 MG PO TABS: 1.0000 mg | ORAL_TABLET | ORAL | Status: DC | PRN

## 2022-11-29 MED ORDER — ACETAMINOPHEN 325 MG PO TABS
650.0000 mg | ORAL_TABLET | Freq: Four times a day (QID) | ORAL | Status: DC | PRN
  Administered 2022-11-30 (×2): 650 mg via ORAL
  Filled 2022-11-29 (×2): qty 2

## 2022-11-29 MED ORDER — HYDRALAZINE HCL 20 MG/ML IJ SOLN
10.0000 mg | Freq: Four times a day (QID) | INTRAMUSCULAR | Status: DC | PRN
  Administered 2022-11-29: 10 mg via INTRAVENOUS
  Filled 2022-11-29 (×2): qty 1

## 2022-11-29 MED ORDER — FENTANYL CITRATE PF 50 MCG/ML IJ SOSY
100.0000 ug | PREFILLED_SYRINGE | Freq: Once | INTRAMUSCULAR | Status: AC
  Administered 2022-11-29: 100 ug via INTRAVENOUS
  Filled 2022-11-29: qty 2

## 2022-11-29 NOTE — ED Notes (Signed)
Patient transported to CT 

## 2022-11-29 NOTE — H&P (Addendum)
History and Physical    Patient: Julie Roberson HOO:[id number] DOB: 11/02/1975 DOA: 11/28/2022 DOS: the patient was seen and examined on 11/29/2022 PCP: Patient, No Pcp Per  Patient coming from: Home  Chief Complaint:  Chief Complaint  Patient presents with   Abdominal Pain   HPI: Julie Roberson is a 47 y.o. female with medical history significant of hypertension and regular alcohol use.  Patient was brought to the emergency room by family.  She was complaining of mid abdominal pain that began at 5:30 in the morning and after passing a bowel.  This was also associated with nausea and intractable vomiting.  She does not have a PCP and therefore has run out of blood pressure medications and has not been able to obtain refills.  Upon presentation to the ER patient was afebrile, BP was significantly elevated at 215/96, other vital signs were stable her room air sats were 100%.  Labs revealed a slightly elevated calcium of 10 point, elevated lipase of 581, AST 81, white count 12,100, hemoglobin 14.2, MCV 106.8 abnormal urinalysis with 80 ketones positive nitrite and few bacteria.  Because of her symptoms and abnormal lipase a CT of the abdomen pelvis with contrast was obtained which revealed findings of acute pancreatic Kinnie Scales with extensive peripancreatic fat stranding and free fluid multiple tiny calcifications in the pancreatic head compatible with chronic pancreatitis Imlygic changes or necrosis noted.  She was found to have hepatic steatosis as well as gastric wall thickening suggestive of gastritis.  Also no abscess or pancreatic ductal dilatation.  EDP requested hospitalist evaluate for admission.  In regards to her hypertension she was given several doses of hydralazine with some improvement in her blood pressure readings.  Pain is also contributing to her elevated BP and this has been controlled with IV Dilaudid.   Review of Systems: As mentioned in the history of present illness. All other  systems reviewed and are negative. Past Medical History:  Diagnosis Date   Hypertension    Past Surgical History:  Procedure Laterality Date   ABDOMINAL HYSTERECTOMY Bilateral 05/18/2015   Procedure: HYSTERECTOMY ABDOMINAL BILATERAL SALPINGECTOMY;  Surgeon: Reva Bores, MD;  Location: [facility];  Service: Gynecology;  Laterality: Bilateral;   CESAREAN SECTION     Social History:  reports that she has been smoking cigarettes. She has been smoking an average of .25 packs per day. She has never used smokeless tobacco. She reports current alcohol use of about 7.0 standard drinks of alcohol per week. She reports that she does not use drugs.  No Known Allergies  Family History  Problem Relation Age of Onset   Heart failure Mother    Healthy Father     Prior to Admission medications   Medication Sig Start Date End Date Taking? Authorizing Provider  amLODipine (NORVASC) 5 MG tablet Take 1 tablet (5 mg total) by mouth daily. Patient not taking: Reported on 11/29/2022 04/22/20   Wallis Bamberg, PA-C  benzonatate (TESSALON) 100 MG capsule Take 1-2 capsules (100-200 mg total) by mouth 3 (three) times daily as needed. Patient not taking: Reported on 11/29/2022 04/22/20   Wallis Bamberg, PA-C  fluticasone [facility]) 50 MCG/ACT nasal spray Place 1-2 sprays into both nostrils daily for 7 days. Patient not taking: Reported on 11/29/2022 12/20/19 12/27/19  Wieters, Fran Lowes C, PA-C  omeprazole (PRILOSEC) 20 MG capsule Take 1 capsule (20 mg total) by mouth daily. Patient not taking: Reported on 11/29/2022 08/21/19   Tegeler, Canary Brim, MD  ondansetron (ZOFRAN ODT) 4 MG  disintegrating tablet Take 1 tablet (4 mg total) by mouth every 8 (eight) hours as needed for nausea or vomiting. Patient not taking: Reported on 11/29/2022 12/20/19   Wieters, Hallie C, PA-C  ondansetron (ZOFRAN) 4 MG tablet Take 1 tablet (4 mg total) by mouth every 8 (eight) hours as needed. Patient not taking: Reported on 11/29/2022 08/21/19   Tegeler,  Canary Brimhristopher J, MD    Physical Exam: Vitals:   11/29/22 0920 11/29/22 0926 11/29/22 0930 11/29/22 1030  BP: (!) 181/93  (!) 180/97 (!) 194/90  Pulse: 88  82 88  Resp:   17 20  Temp:  98.3 F (36.8 C)    TempSrc:  Oral    SpO2:   98% 99%  Weight:      Height:       Constitutional: NAD, calm, uncomfortable Respiratory: clear to auscultation bilaterally, no wheezing, no crackles. Normal respiratory effort. No accessory muscle use.  Room air Cardiovascular: Regular rate and rhythm, no murmurs / rubs / gallops. No extremity edema. 2+ pedal pulses.  Abdomen: No last tenderness throughout all 4 abdominal quadrants with significant guarding but no rebounding. No masses palpated. No hepatosplenomegaly. Bowel sounds positive but are hypoactive Musculoskeletal: no clubbing / cyanosis. No joint deformity upper and lower extremities. Good ROM, no contractures. Normal muscle tone.  Skin: no rashes, lesions, ulcers. No induration Neurologic: CN 2-12 grossly intact. Sensation intact, DTR normal. Strength 5/5 x all 4 extremities.  Psychiatric: Normal judgment and insight. Alert and oriented x 3. Normal mood.     Data Reviewed:  As per HPI  Assessment and Plan: Severe abdominal pain/acute on chronic pancreatitis Presented with abrupt onset of symptoms with elevated lipase 544 CT confirms acute pancreatitis plus findings of chronic pancreatitis N.p.o. although will provide clear liquids in the event patient is ready to begin sips of clears Supportive care and symptom management with IV fluid hydration, IV pain meds, bowel rest, IV antiemetics Avoid precipitating factors with extremities patient's case is regular alcohol use Follow lipase.  If symptoms worsen or if she develops fever, progressive leukocytosis may need to repeat CT scan  Acute gastritis Incidental finding on CT Protonix IV every 12 hours  Acute on chronic nausea and vomiting Patient reports she has intermittent episodes of  emesis for "years" Current symptoms are different See above regarding treatment plan Rule out viral etiology, checking gastrointestinal panel PCR  Regular alcohol use Patient admits to two 40 ounce beers per day and on the weekends occasionally increases to 6 or 7 40 ounce beers Infrequent use of marijuana Ativan CIWA  Uncontrolled hypertension Secondary to inability to access medications due to lack of PCP and current acute nausea and vomiting Begin clonidine patch 0.2 mg weekly-can transition to oral medications once able to tolerate Continue IV hydralazine as needed  Abnormal urinalysis Concerning for possible UTI Obtain urine culture Begin empiric Rocephin IV    Advance Care Planning:   Code Status: Full Code   VTE prophylaxis: Lovenox  Consults: None  Family Communication: Patient tolerating  Severity of Illness: The appropriate patient status for this patient is INPATIENT. Inpatient status is judged to be reasonable and necessary in order to provide the required intensity of service to ensure the patient's safety. The patient's presenting symptoms, physical exam findings, and initial radiographic and laboratory data in the context of their chronic comorbidities is felt to place them at high risk for further clinical deterioration. Furthermore, it is not anticipated that the patient will be medically stable  for discharge from the hospital within 2 midnights of admission.   * I certify that at the point of admission it is my clinical judgment that the patient will require inpatient hospital care spanning beyond 2 midnights from the point of admission due to high intensity of service, high risk for further deterioration and high frequency of surveillance required.*  Author: Junious Silk, NP 11/29/2022 12:11 PM  For on call review [email or web address].

## 2022-11-29 NOTE — ED Provider Notes (Signed)
Patient found to have acute pancreatitis without complicating features.  She is still having pain.  Plan for admission.  Discussed with Dr. Roda Shutters for admission    Zadie Rhine, MD 11/29/22 [phone]

## 2022-11-29 NOTE — ED Notes (Signed)
ED TO INPATIENT HANDOFF REPORT  ED Nurse Name and Phone #: [phone]  S Name/Age/Gender Julie Roberson 47 y.o. female Room/Bed: 009C/009C  Code Status   Code Status: Full Code  Home/SNF/Other Home Patient oriented to: self, place, time, and situation Is this baseline? Yes   Triage Complete: Triage complete  Chief Complaint Acute recurrent pancreatitis [K85.90]  Triage Note Patient arrives in wheelchair by POV with family c/o mid abdominal pain onset of 5:30 am this morning right after having a BM. Also having nausea. Patient states she is supposed to be on meds for BP but does not see a PCP anymore.    Allergies No Known Allergies  Level of Care/Admitting Diagnosis ED Disposition     ED Disposition  Admit   Condition  --   Comment  Hospital Area: [facility] [[id number]]  Level of Care: Med-Surg [16]  May admit patient to [facility] or [facility] if equivalent level of care is available:: No  Covid Evaluation: Confirmed COVID Negative  Diagnosis: Acute recurrent pancreatitis [711657]  Admitting Physician: Albertine Grates [[id number]]  Attending Physician: Albertine Grates [[id number]]  Certification:: I certify this patient will need inpatient services for at least 2 midnights  Estimated Length of Stay: 2          B Medical/Surgery History Past Medical History:  Diagnosis Date   Hypertension    Past Surgical History:  Procedure Laterality Date   ABDOMINAL HYSTERECTOMY Bilateral 05/18/2015   Procedure: HYSTERECTOMY ABDOMINAL BILATERAL SALPINGECTOMY;  Surgeon: Reva Bores, MD;  Location: [facility];  Service: Gynecology;  Laterality: Bilateral;   CESAREAN SECTION       A IV Location/Drains/Wounds Patient Lines/Drains/Airways Status     Active Line/Drains/Airways     Name Placement date Placement time Site Days   Peripheral IV 11/28/22 20 G Right Antecubital 11/28/22  2208  Antecubital  1   Incision (Closed) 05/18/15 Abdomen 05/18/15  0917  -- 2752   Incision  (Closed) 05/18/15 Vagina Other (Comment) 05/18/15  0918  -- 2752            Intake/Output Last 24 hours No intake or output data in the 24 hours ending 11/29/22 1200  Labs/Imaging Results for orders placed or performed during the hospital encounter of 11/28/22 (from the past 48 hour(s))  Lipase, blood     Status: Abnormal   Collection Time: 11/28/22  5:04 PM  Result Value Ref Range   Lipase 551 (H) 11 - 51 U/L    Comment: RESULTS CONFIRMED BY MANUAL DILUTION Performed at [facility], [address], [place], [place] [address]   Comprehensive metabolic panel     Status: Abnormal   Collection Time: 11/28/22  5:04 PM  Result Value Ref Range   Sodium 135 135 - 145 mmol/L   Potassium 3.6 3.5 - 5.1 mmol/L   Chloride 101 98 - 111 mmol/L   CO2 21 (L) 22 - 32 mmol/L   Glucose, Bld 111 (H) 70 - 99 mg/dL    Comment: Glucose reference range applies only to samples taken after fasting for at least 8 hours.   BUN <5 (L) 6 - 20 mg/dL   Creatinine, Ser 0.45 0.44 - 1.00 mg/dL   Calcium 99.7 (H) 8.9 - 10.3 mg/dL   Total Protein 7.9 6.5 - 8.1 g/dL   Albumin 4.4 3.5 - 5.0 g/dL   AST 81 (H) 15 - 41 U/L   ALT 40 0 - 44 U/L   Alkaline Phosphatase  110 38 - 126 U/L   Total Bilirubin 0.6 0.3 - 1.2 mg/dL   GFR, Estimated >16 >10 mL/min    Comment: (NOTE) Calculated using the CKD-EPI Creatinine Equation (2021)    Anion gap 13 5 - 15    Comment: Performed at [facility], [address], [place], [place] [address]  CBC     Status: Abnormal   Collection Time: 11/28/22  5:04 PM  Result Value Ref Range   WBC 12.1 (H) 4.0 - 10.5 K/uL   RBC 3.81 (L) 3.87 - 5.11 MIL/uL   Hemoglobin 14.2 12.0 - 15.0 g/dL   HCT 40.9 81.1 - 91.4 %   MCV 106.8 (H) 80.0 - 100.0 fL   MCH 37.3 (H) 26.0 - 34.0 pg   MCHC 34.9 30.0 - 36.0 g/dL   RDW 78.2 95.6 - 21.3 %   Platelets 201 150 - 400 K/uL   nRBC 0.2 0.0 - 0.2 %    Comment: Performed at [facility], [address], [place], [place] [address]   Urinalysis, Routine w reflex microscopic -Urine, Clean Catch     Status: Abnormal   Collection Time: 11/28/22  5:20 PM  Result Value Ref Range   Color, Urine AMBER (A) YELLOW    Comment: BIOCHEMICALS MAY BE AFFECTED BY COLOR   APPearance HAZY (A) CLEAR   Specific Gravity, Urine 1.021 1.005 - 1.030   pH 6.0 5.0 - 8.0   Glucose, UA NEGATIVE NEGATIVE mg/dL   Hgb urine dipstick NEGATIVE NEGATIVE   Bilirubin Urine SMALL (A) NEGATIVE   Ketones, ur 80 (A) NEGATIVE mg/dL   Protein, ur 846 (A) NEGATIVE mg/dL   Nitrite POSITIVE (A) NEGATIVE   Leukocytes,Ua NEGATIVE NEGATIVE   RBC / HPF 0-5 0 - 5 RBC/hpf   WBC, UA 0-5 0 - 5 WBC/hpf   Bacteria, UA FEW (A) NONE SEEN   Squamous Epithelial / HPF 0-5 0 - 5 /HPF   Mucus PRESENT    Hyaline Casts, UA PRESENT     Comment: Performed at [facility], [address], [place], [place] [address]   CT ABDOMEN PELVIS W CONTRAST  Result Date: 11/29/2022 CLINICAL DATA:  Pancreatitis, acute, severe. Mid abdominal pain with nausea. EXAM: CT ABDOMEN AND PELVIS WITH CONTRAST TECHNIQUE: Multidetector CT imaging of the abdomen and pelvis was performed using the standard protocol following bolus administration of intravenous contrast. RADIATION DOSE REDUCTION: This exam was performed according to the departmental dose-optimization program which includes automated exposure control, adjustment of the mA and/or kV according to patient size and/or use of iterative reconstruction technique. CONTRAST:  75mL OMNIPAQUE IOHEXOL 350 MG/ML SOLN COMPARISON:  03/02/2013. FINDINGS: Lower chest: No acute abnormality. Hepatobiliary: No focal liver abnormality is seen. There is fatty infiltration of the liver. No gallstones, gallbladder wall thickening, or biliary dilatation. Pancreas: There is extensive peripancreatic fat stranding and free fluid extending into the anterior pararenal spaces bilaterally and in the perisplenic space. Multiple tiny calcifications are noted in the  pancreatic head, compatible with chronic pancreatitis. No pancreatic ductal dilatation. Spleen: Normal in size without focal abnormality. Adrenals/Urinary Tract: The adrenal glands are within normal limits. The kidneys enhance symmetrically. No renal calculus or hydronephrosis. The bladder is unremarkable. Stomach/Bowel: There is thickening of the gastric rugae. Appendix appears normal. No evidence of bowel wall thickening, distention, or inflammatory changes. No free air or pneumatosis. Vascular/Lymphatic: Aortic atherosclerosis. No enlarged abdominal or pelvic lymph nodes. Reproductive: Status post hysterectomy. No adnexal masses. Other:  Free fluid in the anterior perianal renal spaces bilaterally, perisplenic space, and pelvis. Cystic structure is present in the perineum on the left suggesting Bartholin gland cyst. Musculoskeletal: No acute osseous abnormality. IMPRESSION: 1. Findings compatible with acute on chronic pancreatitis. No abscess or pancreatic ductal dilatation is seen. 2. Gastric wall thickening, suggesting gastritis. 3. Hepatic steatosis. 4. Mild ascites. 5. Aortic atherosclerosis. Electronically Signed   By: Thornell SartoriusLaura  Taylor M.D.   On: 11/29/2022 00:32    Pending Labs Unresulted Labs (From admission, onward)     Start     Ordered   12/06/22 0500  Creatinine, serum  (enoxaparin (LOVENOX)    CrCl >/= 30 ml/min)  Weekly,   R     Comments: while on enoxaparin therapy    11/29/22 1151   11/30/22 0500  Basic metabolic panel  Tomorrow morning,   R        11/29/22 1151   11/30/22 0500  Comprehensive metabolic panel  Tomorrow morning,   R        11/29/22 1151   11/29/22 1151  CBC  (enoxaparin (LOVENOX)    CrCl >/= 30 ml/min)  Once,   R       Comments: Baseline for enoxaparin therapy IF NOT ALREADY DRAWN.  Notify MD if PLT < 100 K.    11/29/22 1151   11/29/22 1151  Creatinine, serum  (enoxaparin (LOVENOX)    CrCl >/= 30 ml/min)  Once,   R       Comments: Baseline for enoxaparin therapy IF NOT  ALREADY DRAWN.    11/29/22 1151   11/29/22 1150  HIV Antibody (routine testing w rflx)  (HIV Antibody (Routine testing w reflex) panel)  Once,   R        11/29/22 1151   11/29/22 0819  Gastrointestinal Panel by PCR , Stool  (Gastrointestinal Panel by PCR, Stool                                                                                                                                                     **Does Not include CLOSTRIDIUM DIFFICILE testing. **If CDIFF testing is needed, place order from the "C Difficile Testing" order set.**)  Once,   R        11/29/22 0818   11/29/22 0819  Urinalysis, w/ Reflex to Culture (Infection Suspected) -Urine, Clean Catch  (Urine Labs)  Once,   R       Question:  Specimen Source  Answer:  Urine, Clean Catch   11/29/22 0819            Vitals/Pain Today's Vitals   11/29/22 0920 11/29/22 0926 11/29/22 0930 11/29/22 1030  BP: (!) 181/93  (!) 180/97 (!) 194/90  Pulse: 88  82 88  Resp:   17 20  Temp:  98.3 F (36.8 C)  TempSrc:  Oral    SpO2:   98% 99%  Weight:      Height:      PainSc:        Isolation Precautions Enteric precautions (UV disinfection)  Medications Medications  lactated ringers infusion ( Intravenous New Bag/Given 11/29/22 0920)  hydrALAZINE (APRESOLINE) injection 10 mg (has no administration in time range)  morphine (PF) 2 MG/ML injection 2 mg (has no administration in time range)  LORazepam (ATIVAN) tablet 1-4 mg ( Oral See Alternative 11/29/22 0845)    Or  LORazepam (ATIVAN) injection 1-4 mg (1 mg Intravenous Given 11/29/22 0845)  thiamine (VITAMIN B1) tablet 100 mg (100 mg Oral Given 11/29/22 0845)    Or  thiamine (VITAMIN B1) injection 100 mg ( Intravenous See Alternative 11/29/22 0845)  folic acid (FOLVITE) tablet 1 mg (1 mg Oral Given 11/29/22 0844)  multivitamin with minerals tablet 1 tablet (1 tablet Oral Given 11/29/22 0844)  LORazepam (ATIVAN) injection 0-4 mg ( Intravenous Not Given 11/29/22 0947)    Followed  by  LORazepam (ATIVAN) injection 0-4 mg (has no administration in time range)  cloNIDine (CATAPRES - Dosed in mg/24 hr) patch 0.2 mg (0.2 mg Transdermal Patch Applied 11/29/22 0920)  cefTRIAXone (ROCEPHIN) 1 g in sodium chloride 0.9 % 100 mL IVPB (0 g Intravenous Stopped 11/29/22 0920)  enoxaparin (LOVENOX) injection 40 mg (has no administration in time range)  sodium chloride flush (NS) 0.9 % injection 3 mL (has no administration in time range)  acetaminophen (TYLENOL) tablet 650 mg (has no administration in time range)    Or  acetaminophen (TYLENOL) suppository 650 mg (has no administration in time range)  ondansetron (ZOFRAN) tablet 4 mg (has no administration in time range)    Or  ondansetron (ZOFRAN) injection 4 mg (has no administration in time range)  ondansetron (ZOFRAN) injection 4 mg (4 mg Intravenous Given 11/28/22 2208)  lactated ringers bolus 1,000 mL (0 mLs Intravenous Stopped 11/28/22 2302)  morphine (PF) 4 MG/ML injection 4 mg (4 mg Intravenous Given 11/28/22 2208)  iohexol (OMNIPAQUE) 350 MG/ML injection 75 mL (75 mLs Intravenous Contrast Given 11/29/22 0016)  fentaNYL (SUBLIMAZE) injection 100 mcg (100 mcg Intravenous Given 11/29/22 0051)    Mobility walks     Focused Assessments    R Recommendations: See Admitting Provider Note  Report given to:   Additional Notes:

## 2022-11-30 ENCOUNTER — Inpatient Hospital Stay (HOSPITAL_COMMUNITY): Payer: Medicaid Other

## 2022-11-30 DIAGNOSIS — N390 Urinary tract infection, site not specified: Secondary | ICD-10-CM | POA: Insufficient documentation

## 2022-11-30 DIAGNOSIS — F1090 Alcohol use, unspecified, uncomplicated: Secondary | ICD-10-CM | POA: Insufficient documentation

## 2022-11-30 DIAGNOSIS — R188 Other ascites: Secondary | ICD-10-CM | POA: Diagnosis not present

## 2022-11-30 DIAGNOSIS — K861 Other chronic pancreatitis: Secondary | ICD-10-CM

## 2022-11-30 DIAGNOSIS — K859 Acute pancreatitis without necrosis or infection, unspecified: Secondary | ICD-10-CM | POA: Diagnosis not present

## 2022-11-30 DIAGNOSIS — I10 Essential (primary) hypertension: Secondary | ICD-10-CM | POA: Insufficient documentation

## 2022-11-30 DIAGNOSIS — K2970 Gastritis, unspecified, without bleeding: Secondary | ICD-10-CM | POA: Insufficient documentation

## 2022-11-30 DIAGNOSIS — F1010 Alcohol abuse, uncomplicated: Secondary | ICD-10-CM | POA: Insufficient documentation

## 2022-11-30 LAB — CBC WITH DIFFERENTIAL/PLATELET
Abs Immature Granulocytes: 0.04 10*3/uL (ref 0.00–0.07)
Basophils Absolute: 0 10*3/uL (ref 0.0–0.1)
Basophils Relative: 0 %
Eosinophils Absolute: 0.1 10*3/uL (ref 0.0–0.5)
Eosinophils Relative: 1 %
HCT: 32.4 % — ABNORMAL LOW (ref 36.0–46.0)
Hemoglobin: 11.2 g/dL — ABNORMAL LOW (ref 12.0–15.0)
Immature Granulocytes: 1 %
Lymphocytes Relative: 23 %
Lymphs Abs: 2 10*3/uL (ref 0.7–4.0)
MCH: 36.6 pg — ABNORMAL HIGH (ref 26.0–34.0)
MCHC: 34.6 g/dL (ref 30.0–36.0)
MCV: 105.9 fL — ABNORMAL HIGH (ref 80.0–100.0)
Monocytes Absolute: 0.6 10*3/uL (ref 0.1–1.0)
Monocytes Relative: 7 %
Neutro Abs: 6 10*3/uL (ref 1.7–7.7)
Neutrophils Relative %: 68 %
Platelets: 143 10*3/uL — ABNORMAL LOW (ref 150–400)
RBC: 3.06 MIL/uL — ABNORMAL LOW (ref 3.87–5.11)
RDW: 13.8 % (ref 11.5–15.5)
WBC: 8.7 10*3/uL (ref 4.0–10.5)
nRBC: 0 % (ref 0.0–0.2)

## 2022-11-30 LAB — COMPREHENSIVE METABOLIC PANEL
ALT: 17 U/L (ref 0–44)
AST: 24 U/L (ref 15–41)
Albumin: 2.7 g/dL — ABNORMAL LOW (ref 3.5–5.0)
Alkaline Phosphatase: 68 U/L (ref 38–126)
Anion gap: 7 (ref 5–15)
BUN: <5 mg/dL — ABNORMAL LOW (ref 6–20)
CO2: 20 mmol/L — ABNORMAL LOW (ref 22–32)
Calcium: 9.2 mg/dL (ref 8.9–10.3)
Chloride: 105 mmol/L (ref 98–111)
Creatinine, Ser: 0.47 mg/dL (ref 0.44–1.00)
GFR, Estimated: >60 mL/min (ref >60)
Glucose, Bld: 70 mg/dL (ref 70–99)
Potassium: 2.7 mmol/L — CL (ref 3.5–5.1)
Sodium: 132 mmol/L — ABNORMAL LOW (ref 135–145)
Total Bilirubin: 0.9 mg/dL (ref 0.3–1.2)
Total Protein: 5.2 g/dL — ABNORMAL LOW (ref 6.5–8.1)

## 2022-11-30 LAB — MAGNESIUM: Magnesium: 1.7 mg/dL (ref 1.7–2.4)

## 2022-11-30 LAB — LIPASE, BLOOD: Lipase: 632 U/L — ABNORMAL HIGH (ref 11–51)

## 2022-11-30 MED ORDER — POTASSIUM CHLORIDE 20 MEQ PO PACK
40.0000 meq | PACK | ORAL | Status: AC
  Administered 2022-11-30 (×2): 40 meq via ORAL
  Filled 2022-11-30 (×2): qty 2

## 2022-11-30 MED ORDER — AMLODIPINE BESYLATE 5 MG PO TABS
5.0000 mg | ORAL_TABLET | Freq: Every day | ORAL | Status: DC
  Administered 2022-11-30: 5 mg via ORAL
  Filled 2022-11-30: qty 1

## 2022-11-30 MED ORDER — POTASSIUM CHLORIDE CRYS ER 20 MEQ PO TBCR: 40.0000 meq | EXTENDED_RELEASE_TABLET | ORAL | Status: DC

## 2022-11-30 MED ORDER — PANTOPRAZOLE SODIUM 40 MG IV SOLR
40.0000 mg | Freq: Two times a day (BID) | INTRAVENOUS | Status: DC
  Administered 2022-11-30 – 2022-12-01 (×3): 40 mg via INTRAVENOUS
  Filled 2022-11-30 (×3): qty 10

## 2022-11-30 MED ORDER — HYDRALAZINE HCL 20 MG/ML IJ SOLN
10.0000 mg | Freq: Four times a day (QID) | INTRAMUSCULAR | Status: DC | PRN
  Administered 2022-11-30: 10 mg via INTRAVENOUS
  Filled 2022-11-30: qty 1

## 2022-11-30 MED ORDER — ENOXAPARIN SODIUM 30 MG/0.3ML IJ SOSY
30.0000 mg | PREFILLED_SYRINGE | Freq: Every day | INTRAMUSCULAR | Status: DC
  Administered 2022-12-01: 30 mg via SUBCUTANEOUS
  Filled 2022-11-30 (×2): qty 0.3

## 2022-11-30 MED ORDER — NICOTINE 21 MG/24HR TD PT24
21.0000 mg | MEDICATED_PATCH | Freq: Every day | TRANSDERMAL | Status: DC
  Administered 2022-11-30: 21 mg via TRANSDERMAL
  Filled 2022-11-30 (×2): qty 1

## 2022-11-30 MED ORDER — POTASSIUM CHLORIDE CRYS ER 20 MEQ PO TBCR
40.0000 meq | EXTENDED_RELEASE_TABLET | ORAL | Status: DC
  Administered 2022-11-30: 40 meq via ORAL
  Filled 2022-11-30: qty 2

## 2022-11-30 NOTE — Progress Notes (Addendum)
Lab personnel Curley Spice called with a critical of K+ 2.7. Hospitalist on call paged for advise on management.  NB:Hi Dr. This patient has a critical K+ 2.7. She is on LR 125 ml /hr. She can swallow pills whole. Kindly advise.  Dr. Lenna Gilford Potassium PO and Magnesium level lab draw.

## 2022-11-30 NOTE — Progress Notes (Signed)
PROGRESS NOTE    Julie Roberson  CXK:[id number] DOB: [birth year] DOA: 11/28/2022 PCP: Patient, No Pcp Per   Brief Narrative:  HPI: Julie Roberson is a 47 y.o. female with medical history significant of hypertension and regular alcohol use.  Patient was brought to the emergency room by family.  She was complaining of mid abdominal pain that began at 5:30 in the morning and after passing a bowel.  This was also associated with nausea and intractable vomiting.  She does not have a PCP and therefore has run out of blood pressure medications and has not been able to obtain refills.   Upon presentation to the ER patient was afebrile, BP was significantly elevated at 215/96, other vital signs were stable her room air sats were 100%.  Labs revealed a slightly elevated calcium of 10 point, elevated lipase of 581, AST 81, white count 12,100, hemoglobin 14.2, MCV 106.8 abnormal urinalysis with 80 ketones positive nitrite and few bacteria.  Because of her symptoms and abnormal lipase a CT of the abdomen pelvis with contrast was obtained which revealed findings of acute pancreatic Kinnie Scales with extensive peripancreatic fat stranding and free fluid multiple tiny calcifications in the pancreatic head compatible with chronic pancreatitis Imlygic changes or necrosis noted.  She was found to have hepatic steatosis as well as gastric wall thickening suggestive of gastritis.  Also no abscess or pancreatic ductal dilatation.  EDP requested hospitalist evaluate for admission.   In regards to her hypertension she was given several doses of hydralazine with some improvement in her blood pressure readings.  Pain is also contributing to her elevated BP and this has been controlled with IV Dilaudid.    Assessment & Plan:   Principal Problem:   Acute on chronic pancreatitis Active Problems:   Alcohol abuse   Gastritis   Uncontrolled hypertension   UTI (urinary tract infection)  Acute on chronic pancreatitis: CT confirms  acute pancreatitis plus findings of chronic pancreatitis however patient tells me that she has never been diagnosed with pancreatitis before.  Upon further questioning, she does endorse that every now and then, she has experienced back pain which she had not noted.  It is very likely that she may have suffered acute pancreatitis multiple times when she had ignored the symptoms.  Nonetheless, her pain is better.  We are going to advance her to full liquid diet and then to soft diet later today.  Continue current as needed medications and supportive treatment.  Patient in agreement with this.  Although based on the history, this is likely alcohol induced acute on chronic pancreatitis but CT abdomen and pelvis does not comment anything about the gallbladder.  For the sake of completeness, I will obtain ultrasound gallbladder to rule out gallstones as a cause as well.   Acute gastritis Not on common to find gastritis in a patient with acute pancreatitis with a history of alcohol abuse.  For some reason, PPI is not ordered.  Will start on PPI twice daily and treat for 1 month.   Acute on chronic nausea and vomiting Secondary to acute pancreatitis.  Resolved.   Regular alcohol use Patient admits to two 40 ounce beers per day and on the weekends occasionally increases to 6 or 7 40 ounce beers, she has never had any withdrawal, likely because she does not give a break to drinking.  She is now interested in quitting.  I had a lengthy discussion with her sister over the phone on speaker phone while I was  seeing her.  Per her request, I will consult TOC to provide her with resources as patient's sister is interested in rehabilitation for her sister.  Continue CIWA protocol with as needed Ativan.   Uncontrolled hypertension Secondary to inability to access medications due to lack of PCP.  Started on clonidine patch yesterday since she was unable to take p.o.  Now she is able to take p.o., I will start her on  amlodipine 5 mg p.o. daily as well as as needed IV hydralazine.  Will likely escalate tomorrow if remains uncontrolled.   UTI: Continue Rocephin.  Hypokalemia: Replace.  DVT prophylaxis: enoxaparin (LOVENOX) injection 30 mg Start: 11/30/22 1000   Code Status: Full Code  Family Communication:  None present at bedside.  Plan of care discussed with patient in length and he/she verbalized understanding and agreed with it.  Also discussed with her sister over the speaker phone.  Status is: Inpatient Remains inpatient appropriate because: Still symptomatic but improving, potential discharge in next 1 to 2 days.   Estimated body mass index is 21.6 kg/m as calculated from the following:   Height as of this encounter: 5' 2.5" (1.588 m).   Weight as of this encounter: 54.4 kg.    Nutritional Assessment: Body mass index is 21.6 kg/m.Marland Kitchen. Seen by dietician.  I agree with the assessment and plan as outlined below: Nutrition Status:        . Skin Assessment: I have examined the patient's skin and I agree with the wound assessment as performed by the wound care RN as outlined below:    Consultants:  None  Procedures:  None  Antimicrobials:  Anti-infectives (From admission, onward)    Start     Dose/Rate Route Frequency Ordered Stop   11/29/22 0900  cefTRIAXone (ROCEPHIN) 1 g in sodium chloride 0.9 % 100 mL IVPB        1 g 200 mL/hr over 30 Minutes Intravenous Every 24 hours 11/29/22 0819           Subjective: Patient seen and examined.  She states that her pain is much better.  She has very minimal nausea but that is not much of a concern for her.  No vomiting.  No other complaint.  Objective: Vitals:   11/29/22 2357 11/30/22 0026 11/30/22 0521 11/30/22 0822  BP: (!) 178/96 (!) 176/92 (!) 167/94 (!) 179/99  Pulse: (!) 108 100 (!) 103 99  Resp: 14 17 17 18   Temp: 98.7 F (37.1 C) 98.8 F (37.1 C) 98.7 F (37.1 C) 98.5 F (36.9 C)  TempSrc: Oral  Oral Oral  SpO2: 97%  100% 100% 97%  Weight:      Height:       No intake or output data in the 24 hours ending 11/30/22 1035 Filed Weights   11/28/22 1657  Weight: 54.4 kg    Examination:  General exam: Appears calm and comfortable  Respiratory system: Clear to auscultation. Respiratory effort normal. Cardiovascular system: S1 & S2 heard, RRR. No JVD, murmurs, rubs, gallops or clicks. No pedal edema. Gastrointestinal system: Abdomen is nondistended, soft and very mild epigastric tenderness.  No organomegaly or masses felt. Normal bowel sounds heard. Central nervous system: Alert and oriented. No focal neurological deficits. Extremities: Symmetric 5 x 5 power. Skin: No rashes, lesions or ulcers Psychiatry: Judgement and insight appear normal. Mood & affect appropriate.    Data Reviewed: I have personally reviewed following labs and imaging studies  CBC: Recent Labs  Lab 11/28/22 1704 11/29/22  1345 11/30/22 0343  WBC 12.1* 10.1 8.7  NEUTROABS  --   --  6.0  HGB 14.2 12.5 11.2*  HCT 40.7 35.1* 32.4*  MCV 106.8* 105.4* 105.9*  PLT 201 169 143*   Basic Metabolic Panel: Recent Labs  Lab 11/28/22 1704 11/29/22 1345 11/30/22 0343  NA 135  --  132*  K 3.6  --  2.7*  CL 101  --  105  CO2 21*  --  20*  GLUCOSE 111*  --  70  BUN <5*  --  <5*  CREATININE 0.52 0.51 0.47  CALCIUM 10.5*  --  9.2  MG  --   --  1.7   GFR: Estimated Creatinine Clearance: 71.9 mL/min (by C-G formula based on SCr of 0.47 mg/dL). Liver Function Tests: Recent Labs  Lab 11/28/22 1704 11/30/22 0343  AST 81* 24  ALT 40 17  ALKPHOS 110 68  BILITOT 0.6 0.9  PROT 7.9 5.2*  ALBUMIN 4.4 2.7*   Recent Labs  Lab 11/28/22 1704 11/30/22 0343  LIPASE 551* 632*   No results for input(s): "AMMONIA" in the last 168 hours. Coagulation Profile: No results for input(s): "INR", "PROTIME" in the last 168 hours. Cardiac Enzymes: No results for input(s): "CKTOTAL", "CKMB", "CKMBINDEX", "TROPONINI" in the last 168  hours. BNP (last 3 results) No results for input(s): "PROBNP" in the last 8760 hours. HbA1C: No results for input(s): "HGBA1C" in the last 72 hours. CBG: No results for input(s): "GLUCAP" in the last 168 hours. Lipid Profile: No results for input(s): "CHOL", "HDL", "LDLCALC", "TRIG", "CHOLHDL", "LDLDIRECT" in the last 72 hours. Thyroid Function Tests: No results for input(s): "TSH", "T4TOTAL", "FREET4", "T3FREE", "THYROIDAB" in the last 72 hours. Anemia Panel: No results for input(s): "VITAMINB12", "FOLATE", "FERRITIN", "TIBC", "IRON", "RETICCTPCT" in the last 72 hours. Sepsis Labs: No results for input(s): "PROCALCITON", "LATICACIDVEN" in the last 168 hours.  No results found for this or any previous visit (from the past 240 hour(s)).   Radiology Studies: CT ABDOMEN PELVIS W CONTRAST  Result Date: 11/29/2022 CLINICAL DATA:  Pancreatitis, acute, severe. Mid abdominal pain with nausea. EXAM: CT ABDOMEN AND PELVIS WITH CONTRAST TECHNIQUE: Multidetector CT imaging of the abdomen and pelvis was performed using the standard protocol following bolus administration of intravenous contrast. RADIATION DOSE REDUCTION: This exam was performed according to the departmental dose-optimization program which includes automated exposure control, adjustment of the mA and/or kV according to patient size and/or use of iterative reconstruction technique. CONTRAST:  60mL OMNIPAQUE IOHEXOL 350 MG/ML SOLN COMPARISON:  03/02/2013. FINDINGS: Lower chest: No acute abnormality. Hepatobiliary: No focal liver abnormality is seen. There is fatty infiltration of the liver. No gallstones, gallbladder wall thickening, or biliary dilatation. Pancreas: There is extensive peripancreatic fat stranding and free fluid extending into the anterior pararenal spaces bilaterally and in the perisplenic space. Multiple tiny calcifications are noted in the pancreatic head, compatible with chronic pancreatitis. No pancreatic ductal dilatation.  Spleen: Normal in size without focal abnormality. Adrenals/Urinary Tract: The adrenal glands are within normal limits. The kidneys enhance symmetrically. No renal calculus or hydronephrosis. The bladder is unremarkable. Stomach/Bowel: There is thickening of the gastric rugae. Appendix appears normal. No evidence of bowel wall thickening, distention, or inflammatory changes. No free air or pneumatosis. Vascular/Lymphatic: Aortic atherosclerosis. No enlarged abdominal or pelvic lymph nodes. Reproductive: Status post hysterectomy. No adnexal masses. Other: Free fluid in the anterior perianal renal spaces bilaterally, perisplenic space, and pelvis. Cystic structure is present in the perineum on the left suggesting Bartholin  gland cyst. Musculoskeletal: No acute osseous abnormality. IMPRESSION: 1. Findings compatible with acute on chronic pancreatitis. No abscess or pancreatic ductal dilatation is seen. 2. Gastric wall thickening, suggesting gastritis. 3. Hepatic steatosis. 4. Mild ascites. 5. Aortic atherosclerosis. Electronically Signed   By: Thornell Sartorius M.D.   On: 11/29/2022 00:32    Scheduled Meds:  amLODipine  5 mg Oral Daily   cloNIDine  0.2 mg Transdermal Weekly   enoxaparin (LOVENOX) injection  30 mg Subcutaneous Daily   folic acid  1 mg Oral Daily   LORazepam  0-4 mg Intravenous Q6H   Followed by   Melene Muller ON 12/01/2022] LORazepam  0-4 mg Intravenous Q12H   multivitamin with minerals  1 tablet Oral Daily   nicotine  21 mg Transdermal Daily   potassium chloride  40 mEq Oral Q4H   sodium chloride flush  3 mL Intravenous Q12H   thiamine  100 mg Oral Daily   Continuous Infusions:  cefTRIAXone (ROCEPHIN)  IV 1 g (11/30/22 0935)   lactated ringers 125 mL/hr at 11/30/22 0307     LOS: 1 day   Hughie Closs, MD [facility]  11/30/2022, 10:35 AM   *Please note that this is a verbal dictation therefore any spelling or grammatical errors are due to the "[employer]agon [employer] One" system  interpretation.  Please page via Amion and do not message via secure chat for urgent patient care matters. Secure chat can be used for non urgent patient care matters.  How to contact the [facility] Attending or Consulting provider 7A - 7P or covering provider during after hours 7P -7A, for this patient?  Check the care team in [facility] and look for a) attending/consulting [facility] provider listed and b) the [facility] team listed. Page or secure chat 7A-7P. Log into [email or web address] and use [place]'s universal password to access. If you do not have the password, please contact the hospital operator. Locate the [facility] provider you are looking for under [facility] and page to a number that you can be directly reached. If you still have difficulty reaching the provider, please page the [facility] (Director on Call) for the Hospitalists listed on amion for assistance.

## 2022-11-30 NOTE — TOC Initial Note (Signed)
Transition of Care [facility]) - Initial/Assessment Note    Patient Details  Name: Julie Roberson MRN: [id number] Date of Birth: [birth year]  Transition of Care [facility]) CM/SW Contact:    Julie Roberson, Julie Roberson Phone Number: 11/30/2022, 4:23 PM  Clinical Narrative:                   Expected Discharge Plan: Home/Self Care Barriers to Discharge: No Barriers Identified   CSW met with pt via phone due to contact precautions. Pt reported that she does smoke cigarettes and very occasionally marijuana and drinks alcohol. She said "it's my love life." Pt declined to state how much use. She said she "will work around it" and stated she is not interested currently in obtaining referral for outpatient follow up. CSW left resources in AVS for pt.   Patient Goals and CMS Choice Patient states their goals for this hospitalization and ongoing recovery are:: Pt states that she wants to get better and go home.          Expected Discharge Plan and Services                                              Prior Living Arrangements/Services       Do you feel safe going back to the place where you live?: Yes            Criminal Activity/Legal Involvement Pertinent to Current Situation/Hospitalization: No - Comment as needed (N/A)  Activities of Daily Living Home Assistive Devices/Equipment: None ADL Screening (condition at time of admission) Patient's cognitive ability adequate to safely complete daily activities?: Yes Is the patient deaf or have difficulty hearing?: No Does the patient have difficulty seeing, even when wearing glasses/contacts?: No Does the patient have difficulty concentrating, remembering, or making decisions?: No Patient able to express need for assistance with ADLs?: Yes Does the patient have difficulty dressing or bathing?: No Independently performs ADLs?: Yes (appropriate for developmental age) Does the patient have difficulty walking or climbing stairs?:  No Weakness of Legs: None Weakness of Arms/Hands: None  Permission Sought/Granted                  Emotional Assessment Appearance:: Appears stated age Attitude/Demeanor/Rapport: Engaged Affect (typically observed): Accepting, Blunt Orientation: : Oriented to Self, Oriented to Place, Oriented to  Time, Oriented to Situation Alcohol / Substance Use: Tobacco Use, Alcohol Use Psych Involvement: No (comment)  Admission diagnosis:  Acute recurrent pancreatitis [K85.90] Acute pancreatitis, unspecified complication status, unspecified pancreatitis type [K85.90] Patient Active Problem List   Diagnosis Date Noted   Alcohol abuse 11/30/2022   Gastritis 11/30/2022   Uncontrolled hypertension 11/30/2022   UTI (urinary tract infection) 11/30/2022   Acute on chronic pancreatitis 11/29/2022   Anxiety and depression    Status post hysterectomy 05/18/2015   Fibroid uterus 02/17/2015   Menorrhagia with regular cycle 02/17/2015   Acute blood loss anemia 02/17/2015   Weight loss 02/17/2015   PCP:  Patient, No Pcp Per Pharmacy:   [id number] [place] Otto, [place] - [address] AT [facility] OF [address] [place] [address] Phone: [phone] Fax: [phone]  [id number] [place] Otto, [place] - [address] AT [facility] OF [address] [place] [address] Phone: [phone] Fax: [phone]  Social Determinants of Health (SDOH) Social History: SDOH Screenings   Food Insecurity: No Food Insecurity (11/29/2022)  Housing: Low Risk  (11/29/2022)  Transportation Needs: No Transportation Needs (11/29/2022)  Utilities: Not At Risk (11/29/2022)  Tobacco Use: High Risk (11/28/2022)   SDOH Interventions: Housing Interventions: Patient Refused   Readmission Risk Interventions     No data to display

## 2022-12-01 ENCOUNTER — Other Ambulatory Visit (HOSPITAL_COMMUNITY): Payer: Self-pay

## 2022-12-01 DIAGNOSIS — K859 Acute pancreatitis without necrosis or infection, unspecified: Secondary | ICD-10-CM | POA: Diagnosis not present

## 2022-12-01 DIAGNOSIS — K861 Other chronic pancreatitis: Secondary | ICD-10-CM | POA: Diagnosis not present

## 2022-12-01 LAB — GASTROINTESTINAL PANEL BY PCR, STOOL (REPLACES STOOL CULTURE)

## 2022-12-01 LAB — CBC WITH DIFFERENTIAL/PLATELET
Abs Immature Granulocytes: 0.02 10*3/uL (ref 0.00–0.07)
Basophils Absolute: 0 10*3/uL (ref 0.0–0.1)
Basophils Relative: 0 %
Eosinophils Absolute: 0.1 10*3/uL (ref 0.0–0.5)
Eosinophils Relative: 2 %
HCT: 31.8 % — ABNORMAL LOW (ref 36.0–46.0)
Hemoglobin: 11.2 g/dL — ABNORMAL LOW (ref 12.0–15.0)
Immature Granulocytes: 0 %
Lymphocytes Relative: 26 %
Lymphs Abs: 2 10*3/uL (ref 0.7–4.0)
MCH: 37 pg — ABNORMAL HIGH (ref 26.0–34.0)
MCHC: 35.2 g/dL (ref 30.0–36.0)
MCV: 105 fL — ABNORMAL HIGH (ref 80.0–100.0)
Monocytes Absolute: 0.7 10*3/uL (ref 0.1–1.0)
Monocytes Relative: 9 %
Neutro Abs: 5 10*3/uL (ref 1.7–7.7)
Neutrophils Relative %: 63 %
Platelets: 140 10*3/uL — ABNORMAL LOW (ref 150–400)
RBC: 3.03 MIL/uL — ABNORMAL LOW (ref 3.87–5.11)
RDW: 13.6 % (ref 11.5–15.5)
WBC: 7.9 10*3/uL (ref 4.0–10.5)
nRBC: 0 % (ref 0.0–0.2)

## 2022-12-01 LAB — BASIC METABOLIC PANEL
Anion gap: 9 (ref 5–15)
BUN: <5 mg/dL — ABNORMAL LOW (ref 6–20)
CO2: 19 mmol/L — ABNORMAL LOW (ref 22–32)
Calcium: 9.2 mg/dL (ref 8.9–10.3)
Chloride: 104 mmol/L (ref 98–111)
Creatinine, Ser: 0.44 mg/dL (ref 0.44–1.00)
GFR, Estimated: >60 mL/min (ref >60)
Glucose, Bld: 62 mg/dL — ABNORMAL LOW (ref 70–99)
Potassium: 3.1 mmol/L — ABNORMAL LOW (ref 3.5–5.1)
Sodium: 132 mmol/L — ABNORMAL LOW (ref 135–145)

## 2022-12-01 MED ORDER — OMEPRAZOLE 40 MG PO CPDR
40.0000 mg | DELAYED_RELEASE_CAPSULE | Freq: Two times a day (BID) | ORAL | 0 refills | Status: DC
  Filled 2022-12-01: qty 60, 30d supply, fill #0

## 2022-12-01 MED ORDER — AMLODIPINE BESYLATE 10 MG PO TABS
10.0000 mg | ORAL_TABLET | Freq: Every day | ORAL | Status: DC
  Administered 2022-12-01: 10 mg via ORAL
  Filled 2022-12-01: qty 1

## 2022-12-01 MED ORDER — ONDANSETRON 4 MG PO TBDP
4.0000 mg | ORAL_TABLET | Freq: Three times a day (TID) | ORAL | 0 refills | Status: DC | PRN
  Filled 2022-12-01: qty 20, 7d supply, fill #0

## 2022-12-01 MED ORDER — LISINOPRIL 20 MG PO TABS
20.0000 mg | ORAL_TABLET | Freq: Every day | ORAL | Status: DC
  Administered 2022-12-01: 20 mg via ORAL
  Filled 2022-12-01: qty 1

## 2022-12-01 MED ORDER — POTASSIUM CHLORIDE 20 MEQ PO PACK
40.0000 meq | PACK | ORAL | Status: DC
  Administered 2022-12-01: 40 meq via ORAL
  Filled 2022-12-01: qty 2

## 2022-12-01 MED ORDER — AMLODIPINE BESYLATE 10 MG PO TABS
10.0000 mg | ORAL_TABLET | Freq: Every day | ORAL | 0 refills | Status: DC
  Filled 2022-12-01: qty 30, 30d supply, fill #0

## 2022-12-01 MED ORDER — LISINOPRIL 20 MG PO TABS
20.0000 mg | ORAL_TABLET | Freq: Every day | ORAL | 0 refills | Status: DC
  Filled 2022-12-01: qty 30, 30d supply, fill #0

## 2022-12-01 NOTE — Discharge Summary (Signed)
Physician Discharge Summary  Julie Roberson ZOX:[id number] DOB: 1976-05-08 DOA: 11/28/2022  PCP: Patient, No Pcp Per  Admit date: 11/28/2022 Discharge date: 12/01/2022 30 Day Unplanned Readmission Risk Score    Flowsheet Row ED to Hosp-Admission (Discharged) from 11/28/2022 in [facility]  30 Day Unplanned Readmission Risk Score (%) 8.97 Filed at 12/01/2022 0801       This score is the patient's risk of an unplanned readmission within 30 days of being discharged (0 -100%). The score is based on dignosis, age, lab data, medications, orders, and past utilization.   Low:  0-14.9   Medium: 15-21.9   High: 22-29.9   Extreme: 30 and above          Admitted From: Home Disposition: Home  Recommendations for Outpatient Follow-up:  Follow up with PCP in 1-2 weeks Please obtain BMP/CBC in one week Please follow up with your PCP on the following pending results: Unresulted Labs (From admission, onward)     Start     Ordered   12/06/22 0500  Creatinine, serum  (enoxaparin (LOVENOX)    CrCl >/= 30 ml/min)  Weekly,   R     Comments: while on enoxaparin therapy    11/29/22 1151              Home Health: None Equipment/Devices: None  Discharge Condition: Stable CODE STATUS: Full code Diet recommendation: Cardiac  Subjective: Seen and examined.  Feeling much better.  No abdominal pain.  Tolerated regular diet.  Desires to go home.  Brief/Interim Summary: Julie Roberson is a 47 y.o. female with medical history significant of hypertension and regular alcohol use.  Patient was brought to the emergency room by family due to abdominal pain.  Upon arrival to emergency department, BP was significantly elevated at 215/96, other vital signs were stable her room air sats were 100%.  Labs,  lipase of 581, AST 81, white count 12,100, hemoglobin 14.2, MCV 106.8 abnormal urinalysis with 80 ketones positive nitrite and few bacteria. CT of the abdomen pelvis with contrast was obtained  which revealed findings of acute pancreatitis with extensive peripancreatic fat stranding and free fluid multiple tiny calcifications in the pancreatic head compatible with chronic pancreatitis Imlygic changes or necrosis noted.  She was found to have hepatic steatosis as well as gastric wall thickening suggestive of gastritis.  Also no abscess or pancreatic ductal dilatation.  Patient was admitted to hospital service, treated conservatively, initially with clear liquid diet and pain management as IV fluids and as she improved, diet was advanced and today she was able to tolerate regular diet without having any abdominal pain, nausea vomiting and she did not have abdominal tenderness either.  She felt comfortable going home.  Extensive discussion took place between her and myself.  I counseled her to quit alcohol altogether in order for her to avoid future acute pancreatitis attacks.  When speaking to her sister yesterday, she had requested information about resources and possibly rehabilitation.  TOC was consulted and patient was provided resources but according to [facility] note, patient was not interested in rehabilitation at this point in time.  Discussed with her personally today, she says that she thinks that she can manage her addiction alcohol by herself without going to the rehabilitation now that she has a job and she can focus on that.   Acute gastritis Not on common to find gastritis in a patient with acute pancreatitis with a history of alcohol abuse.  She was on PPI  twice daily.  I have discharged her on twice daily PPI for 1 month.   Acute on chronic nausea and vomiting Secondary to acute pancreatitis.  Resolved.   Regular alcohol use Patient admits to two 40 ounce beers per day and on the weekends occasionally increases to 6 or 7 40 ounce beers, she has never had any withdrawal, likely because she does not give a break to drinking.  She is now interested in quitting.  She did not have any  withdrawal symptoms.   Uncontrolled hypertension Secondary to inability to access medications due to lack of PCP.  Started on clonidine patch here however gradually she was started on amlodipine 5 mg but blood pressure remained elevated so this was increased to 10 mg and lisinopril 20 mg p.o. added and all these medications were prescribed to her.  She is very well aware that she has 1 month of supply and she needs to follow-up with PCP in order to get refills.   UTI: Received Rocephin here.   Hypokalemia: Replaced before discharge.  Potassium was 3.1 I ordered 2 doses of potassium chloride 40 mEq every 4 hours but patient received only 1 and she did not want to wait for the second 1.  She left.  Discharge plan was discussed with patient and/or family member and they verbalized understanding and agreed with it.  Discharge Diagnoses:  Principal Problem:   Acute on chronic pancreatitis Active Problems:   Alcohol abuse   Gastritis   Uncontrolled hypertension   UTI (urinary tract infection)    Discharge Instructions   Allergies as of 12/01/2022   No Known Allergies      Medication List     STOP taking these medications    benzonatate 100 MG capsule Commonly known as: TESSALON   fluticasone 50 MCG/ACT nasal spray Commonly known as: FLONASE   ondansetron 4 MG tablet Commonly known as: ZOFRAN       TAKE these medications    amLODipine 10 MG tablet Commonly known as: NORVASC Take 1 tablet (10 mg total) by mouth daily. What changed:  medication strength how much to take   lisinopril 20 MG tablet Commonly known as: ZESTRIL Take 1 tablet (20 mg total) by mouth daily.   omeprazole 40 MG capsule Commonly known as: PRILOSEC Take 1 capsule (40 mg total) by mouth 2 (two) times daily. What changed:  medication strength how much to take when to take this   ondansetron 4 MG disintegrating tablet Commonly known as: ZOFRAN-ODT Take 1 tablet (4 mg total) by mouth every 8  (eight) hours as needed for nausea or vomiting.        Follow-up Information     pcp Follow up in 1 week(s).                 No Known Allergies  Consultations: None   Procedures/Studies: US Abdomen Limited RUQ (LIVER/GB)  Result Date: 11/30/2022 CLINICAL DATA:  Gallstone suspected. EXAM: ULTRASOUND ABDOMEN LIMITED RIGHT UPPER QUADRANT COMPARISON:  Abdominal CT November 29, 2022 FINDINGS: Gallbladder: No gallstones or wall thickening visualized. No sonographic Murphy sign noted by sonographer. Common bile duct: Diameter: 2.1 mm Liver: No focal lesion identified. Diffusely increased parenchymal echogenicity. Portal vein is patent on color Doppler imaging with normal direction of blood flow towards the liver. Other: Perihepatic ascites seen. IMPRESSION: Normal gallbladder. Diffusely increased parenchymal echogenicity of the liver, usually associated with hepatic steatosis or fibrosis. Small volume ascites. Electronically Signed   By: Ted Mcalpine  M.D.   On: 11/30/2022 18:34   CT ABDOMEN PELVIS W CONTRAST  Result Date: 11/29/2022 CLINICAL DATA:  Pancreatitis, acute, severe. Mid abdominal pain with nausea. EXAM: CT ABDOMEN AND PELVIS WITH CONTRAST TECHNIQUE: Multidetector CT imaging of the abdomen and pelvis was performed using the standard protocol following bolus administration of intravenous contrast. RADIATION DOSE REDUCTION: This exam was performed according to the departmental dose-optimization program which includes automated exposure control, adjustment of the mA and/or kV according to patient size and/or use of iterative reconstruction technique. CONTRAST:  75mL OMNIPAQUE IOHEXOL 350 MG/ML SOLN COMPARISON:  03/02/2013. FINDINGS: Lower chest: No acute abnormality. Hepatobiliary: No focal liver abnormality is seen. There is fatty infiltration of the liver. No gallstones, gallbladder wall thickening, or biliary dilatation. Pancreas: There is extensive peripancreatic fat stranding  and free fluid extending into the anterior pararenal spaces bilaterally and in the perisplenic space. Multiple tiny calcifications are noted in the pancreatic head, compatible with chronic pancreatitis. No pancreatic ductal dilatation. Spleen: Normal in size without focal abnormality. Adrenals/Urinary Tract: The adrenal glands are within normal limits. The kidneys enhance symmetrically. No renal calculus or hydronephrosis. The bladder is unremarkable. Stomach/Bowel: There is thickening of the gastric rugae. Appendix appears normal. No evidence of bowel wall thickening, distention, or inflammatory changes. No free air or pneumatosis. Vascular/Lymphatic: Aortic atherosclerosis. No enlarged abdominal or pelvic lymph nodes. Reproductive: Status post hysterectomy. No adnexal masses. Other: Free fluid in the anterior perianal renal spaces bilaterally, perisplenic space, and pelvis. Cystic structure is present in the perineum on the left suggesting Bartholin gland cyst. Musculoskeletal: No acute osseous abnormality. IMPRESSION: 1. Findings compatible with acute on chronic pancreatitis. No abscess or pancreatic ductal dilatation is seen. 2. Gastric wall thickening, suggesting gastritis. 3. Hepatic steatosis. 4. Mild ascites. 5. Aortic atherosclerosis. Electronically Signed   By: Thornell Sartorius M.D.   On: 11/29/2022 00:32     Discharge Exam: Vitals:   12/01/22 0528 12/01/22 0834  BP: (!) 156/96 (!) 166/92  Pulse: 98 89  Resp: 18 18  Temp: 98.3 F (36.8 C) 98.5 F (36.9 C)  SpO2: 98% 100%   Vitals:   11/30/22 1623 11/30/22 2147 12/01/22 0528 12/01/22 0834  BP: (!) 175/97 (!) 198/93 (!) 156/96 (!) 166/92  Pulse: (!) 101 87 98 89  Resp: Temp: 97.6 F (36.4 C) 98.4 F (36.9 C) 98.3 F (36.8 C) 98.5 F (36.9 C)  TempSrc:  Oral Oral Oral  SpO2: 100% 100% 98% 100%  Weight:      Height:        General: Pt is alert, awake, not in acute distress Cardiovascular: RRR, S1/S2 +, no rubs, no  gallops Respiratory: CTA bilaterally, no wheezing, no rhonchi Abdominal: Soft, NT, ND, bowel sounds + Extremities: no edema, no cyanosis    The results of significant diagnostics from this hospitalization (including imaging, microbiology, ancillary and laboratory) are listed below for reference.     Microbiology: Recent Results (from the past 240 hour(s))  Gastrointestinal Panel by PCR , Stool     Status: None   Collection Time: 11/29/22  8:19 AM   Specimen: Stool  Result Value Ref Range Status   Campylobacter species NOT DETECTED NOT DETECTED Final   Plesimonas shigelloides NOT DETECTED NOT DETECTED Final   Salmonella species NOT DETECTED NOT DETECTED Final   Yersinia enterocolitica NOT DETECTED NOT DETECTED Final   Vibrio species NOT DETECTED NOT DETECTED Final   Vibrio cholerae NOT DETECTED NOT DETECTED Final  Enteroaggregative E coli (EAEC) NOT DETECTED NOT DETECTED Final   Enteropathogenic E coli (EPEC) NOT DETECTED NOT DETECTED Final   Enterotoxigenic E coli (ETEC) NOT DETECTED NOT DETECTED Final   Shiga like toxin producing E coli (STEC) NOT DETECTED NOT DETECTED Final   Shigella/Enteroinvasive E coli (EIEC) NOT DETECTED NOT DETECTED Final   Cryptosporidium NOT DETECTED NOT DETECTED Final   Cyclospora cayetanensis NOT DETECTED NOT DETECTED Final   Entamoeba histolytica NOT DETECTED NOT DETECTED Final   Giardia lamblia NOT DETECTED NOT DETECTED Final   Adenovirus F40/41 NOT DETECTED NOT DETECTED Final   Astrovirus NOT DETECTED NOT DETECTED Final   Norovirus GI/GII NOT DETECTED NOT DETECTED Final   Rotavirus A NOT DETECTED NOT DETECTED Final   Sapovirus (I, II, IV, and V) NOT DETECTED NOT DETECTED Final    Comment: Performed at [facility], [address] [place], [place] [address]     Labs: BNP (last 3 results) No results for input(s): "BNP" in the last 8760 hours. Basic Metabolic Panel: Recent Labs  Lab 11/28/22 1704 11/29/22 1345 11/30/22 0343  12/01/22 0409  NA 135  --  132* 132*  K 3.6  --  2.7* 3.1*  CL 101  --  105 104  CO2 21*  --  20* 19*  GLUCOSE 111*  --  70 62*  BUN <5*  --  <5* <5*  CREATININE 0.52 0.51 0.47 0.44  CALCIUM 10.5*  --  9.2 9.2  MG  --   --  1.7  --    Liver Function Tests: Recent Labs  Lab 11/28/22 1704 11/30/22 0343  AST 81* 24  ALT 40 17  ALKPHOS 110 68  BILITOT 0.6 0.9  PROT 7.9 5.2*  ALBUMIN 4.4 2.7*   Recent Labs  Lab 11/28/22 1704 11/30/22 0343  LIPASE 551* 632*   No results for input(s): "AMMONIA" in the last 168 hours. CBC: Recent Labs  Lab 11/28/22 1704 11/29/22 1345 11/30/22 0343 12/01/22 0409  WBC 12.1* 10.1 8.7 7.9  NEUTROABS  --   --  6.0 5.0  HGB 14.2 12.5 11.2* 11.2*  HCT 40.7 35.1* 32.4* 31.8*  MCV 106.8* 105.4* 105.9* 105.0*  PLT 201 169 143* 140*   Cardiac Enzymes: No results for input(s): "CKTOTAL", "CKMB", "CKMBINDEX", "TROPONINI" in the last 168 hours. BNP: Invalid input(s): "POCBNP" CBG: No results for input(s): "GLUCAP" in the last 168 hours. D-Dimer No results for input(s): "DDIMER" in the last 72 hours. Hgb A1c No results for input(s): "HGBA1C" in the last 72 hours. Lipid Profile No results for input(s): "CHOL", "HDL", "LDLCALC", "TRIG", "CHOLHDL", "LDLDIRECT" in the last 72 hours. Thyroid function studies No results for input(s): "TSH", "T4TOTAL", "T3FREE", "THYROIDAB" in the last 72 hours.  Invalid input(s): "FREET3" Anemia work up No results for input(s): "VITAMINB12", "FOLATE", "FERRITIN", "TIBC", "IRON", "RETICCTPCT" in the last 72 hours. Urinalysis    Component Value Date/Time   COLORURINE AMBER (A) 11/29/2022 1410   APPEARANCEUR HAZY (A) 11/29/2022 1410   LABSPEC 1.029 11/29/2022 1410   PHURINE 5.0 11/29/2022 1410   GLUCOSEU NEGATIVE 11/29/2022 1410   HGBUR MODERATE (A) 11/29/2022 1410   BILIRUBINUR NEGATIVE 11/29/2022 1410   KETONESUR 20 (A) 11/29/2022 1410   PROTEINUR 30 (A) 11/29/2022 1410   UROBILINOGEN 0.2 12/20/2019 1222    NITRITE POSITIVE (A) 11/29/2022 1410   LEUKOCYTESUR NEGATIVE 11/29/2022 1410   Sepsis Labs Recent Labs  Lab 11/28/22 1704 11/29/22 1345 11/30/22 0343 12/01/22 0409  WBC 12.1* 10.1 8.7 7.9  Microbiology Recent Results (from the past 240 hour(s))  Gastrointestinal Panel by PCR , Stool     Status: None   Collection Time: 11/29/22  8:19 AM   Specimen: Stool  Result Value Ref Range Status   Campylobacter species NOT DETECTED NOT DETECTED Final   Plesimonas shigelloides NOT DETECTED NOT DETECTED Final   Salmonella species NOT DETECTED NOT DETECTED Final   Yersinia enterocolitica NOT DETECTED NOT DETECTED Final   Vibrio species NOT DETECTED NOT DETECTED Final   Vibrio cholerae NOT DETECTED NOT DETECTED Final   Enteroaggregative E coli (EAEC) NOT DETECTED NOT DETECTED Final   Enteropathogenic E coli (EPEC) NOT DETECTED NOT DETECTED Final   Enterotoxigenic E coli (ETEC) NOT DETECTED NOT DETECTED Final   Shiga like toxin producing E coli (STEC) NOT DETECTED NOT DETECTED Final   Shigella/Enteroinvasive E coli (EIEC) NOT DETECTED NOT DETECTED Final   Cryptosporidium NOT DETECTED NOT DETECTED Final   Cyclospora cayetanensis NOT DETECTED NOT DETECTED Final   Entamoeba histolytica NOT DETECTED NOT DETECTED Final   Giardia lamblia NOT DETECTED NOT DETECTED Final   Adenovirus F40/41 NOT DETECTED NOT DETECTED Final   Astrovirus NOT DETECTED NOT DETECTED Final   Norovirus GI/GII NOT DETECTED NOT DETECTED Final   Rotavirus A NOT DETECTED NOT DETECTED Final   Sapovirus (I, II, IV, and V) NOT DETECTED NOT DETECTED Final    Comment: Performed at [facility], [address] [place], [place] [address]     Time coordinating discharge: Over 30 minutes  SIGNED:   Hughie Closs, MD  [facility] 12/01/2022, 11:01 AM *Please note that this is a verbal dictation therefore any spelling or grammatical errors are due to the "[employer]agon [employer] One" system interpretation. If 7PM-7AM,  please contact night-coverage [email or web address]

## 2022-12-01 NOTE — Plan of Care (Signed)
  Problem: Education: Goal: Knowledge of General Education information will improve Description: Including pain rating scale, medication(s)/side effects and non-pharmacologic comfort measures Outcome: Progressing   Problem: Clinical Measurements: Goal: Respiratory complications will improve Outcome: Progressing   Problem: Activity: Goal: Risk for activity intolerance will decrease Outcome: Progressing   Problem: Nutrition: Goal: Adequate nutrition will be maintained Outcome: Progressing   Problem: Coping: Goal: Level of anxiety will decrease Outcome: Progressing   Problem: Elimination: Goal: Will not experience complications related to bowel motility Outcome: Progressing Goal: Will not experience complications related to urinary retention Outcome: Progressing   Problem: Pain Managment: Goal: General experience of comfort will improve Outcome: Progressing

## 2023-04-14 ENCOUNTER — Inpatient Hospital Stay (HOSPITAL_COMMUNITY)
Admission: EM | Admit: 2023-04-14 | Discharge: 2023-04-17 | DRG: 439 | Disposition: A | Discharge status: Home / Self Care | Payer: Medicaid Other | Attending: Internal Medicine | Admitting: Internal Medicine

## 2023-04-14 ENCOUNTER — Other Ambulatory Visit: Payer: Self-pay

## 2023-04-14 ENCOUNTER — Encounter (HOSPITAL_COMMUNITY): Payer: Self-pay | Admitting: Emergency Medicine

## 2023-04-14 DIAGNOSIS — Z79899 Other long term (current) drug therapy: Secondary | ICD-10-CM

## 2023-04-14 DIAGNOSIS — Z8249 Family history of ischemic heart disease and other diseases of the circulatory system: Secondary | ICD-10-CM

## 2023-04-14 DIAGNOSIS — F1090 Alcohol use, unspecified, uncomplicated: Secondary | ICD-10-CM | POA: Diagnosis present

## 2023-04-14 DIAGNOSIS — D7589 Other specified diseases of blood and blood-forming organs: Secondary | ICD-10-CM | POA: Diagnosis present

## 2023-04-14 DIAGNOSIS — E871 Hypo-osmolality and hyponatremia: Secondary | ICD-10-CM | POA: Diagnosis present

## 2023-04-14 DIAGNOSIS — F1010 Alcohol abuse, uncomplicated: Secondary | ICD-10-CM | POA: Diagnosis present

## 2023-04-14 DIAGNOSIS — K861 Other chronic pancreatitis: Secondary | ICD-10-CM | POA: Diagnosis present

## 2023-04-14 DIAGNOSIS — R7989 Other specified abnormal findings of blood chemistry: Secondary | ICD-10-CM | POA: Diagnosis present

## 2023-04-14 DIAGNOSIS — D539 Nutritional anemia, unspecified: Secondary | ICD-10-CM | POA: Diagnosis present

## 2023-04-14 DIAGNOSIS — I10 Essential (primary) hypertension: Secondary | ICD-10-CM | POA: Diagnosis present

## 2023-04-14 DIAGNOSIS — K852 Alcohol induced acute pancreatitis without necrosis or infection: Principal | ICD-10-CM | POA: Diagnosis present

## 2023-04-14 DIAGNOSIS — E861 Hypovolemia: Secondary | ICD-10-CM | POA: Diagnosis present

## 2023-04-14 DIAGNOSIS — E876 Hypokalemia: Secondary | ICD-10-CM | POA: Diagnosis present

## 2023-04-14 DIAGNOSIS — F17210 Nicotine dependence, cigarettes, uncomplicated: Secondary | ICD-10-CM | POA: Diagnosis present

## 2023-04-14 DIAGNOSIS — K859 Acute pancreatitis without necrosis or infection, unspecified: Secondary | ICD-10-CM | POA: Diagnosis present

## 2023-04-14 LAB — URINALYSIS, ROUTINE W REFLEX MICROSCOPIC
Bilirubin Urine: NEGATIVE
Glucose, UA: NEGATIVE mg/dL
Hgb urine dipstick: NEGATIVE
Ketones, ur: 5 mg/dL — AB
Leukocytes,Ua: NEGATIVE
Nitrite: NEGATIVE
Protein, ur: NEGATIVE mg/dL
Specific Gravity, Urine: 1.006 (ref 1.005–1.030)
pH: 5 (ref 5.0–8.0)

## 2023-04-14 LAB — CBC
HCT: 40.5 % (ref 36.0–46.0)
Hemoglobin: 14.2 g/dL (ref 12.0–15.0)
MCH: 35.2 pg — ABNORMAL HIGH (ref 26.0–34.0)
MCHC: 35.1 g/dL (ref 30.0–36.0)
MCV: 100.5 fL — ABNORMAL HIGH (ref 80.0–100.0)
Platelets: 179 10*3/uL (ref 150–400)
RBC: 4.03 MIL/uL (ref 3.87–5.11)
RDW: 12.5 % (ref 11.5–15.5)
WBC: 9.6 10*3/uL (ref 4.0–10.5)
nRBC: 0.2 % (ref 0.0–0.2)

## 2023-04-14 LAB — ETHANOL: Alcohol, Ethyl (B): <10 mg/dL (ref <10)

## 2023-04-14 LAB — COMPREHENSIVE METABOLIC PANEL
ALT: 48 U/L — ABNORMAL HIGH (ref 0–44)
AST: 80 U/L — ABNORMAL HIGH (ref 15–41)
Albumin: 3.3 g/dL — ABNORMAL LOW (ref 3.5–5.0)
Alkaline Phosphatase: 124 U/L (ref 38–126)
Anion gap: 11 (ref 5–15)
BUN: <5 mg/dL — ABNORMAL LOW (ref 6–20)
CO2: 17 mmol/L — ABNORMAL LOW (ref 22–32)
Calcium: 9.4 mg/dL (ref 8.9–10.3)
Chloride: 97 mmol/L — ABNORMAL LOW (ref 98–111)
Creatinine, Ser: 0.46 mg/dL (ref 0.44–1.00)
GFR, Estimated: >60 mL/min (ref >60)
Glucose, Bld: 80 mg/dL (ref 70–99)
Potassium: 3 mmol/L — ABNORMAL LOW (ref 3.5–5.1)
Sodium: 125 mmol/L — ABNORMAL LOW (ref 135–145)
Total Bilirubin: 1.4 mg/dL — ABNORMAL HIGH (ref 0.3–1.2)
Total Protein: 6.2 g/dL — ABNORMAL LOW (ref 6.5–8.1)

## 2023-04-14 LAB — MAGNESIUM: Magnesium: 2.1 mg/dL (ref 1.7–2.4)

## 2023-04-14 LAB — LIPASE, BLOOD: Lipase: 707 U/L — ABNORMAL HIGH (ref 11–51)

## 2023-04-14 LAB — PHOSPHORUS: Phosphorus: 2.9 mg/dL (ref 2.5–4.6)

## 2023-04-14 MED ORDER — MORPHINE SULFATE (PF) 4 MG/ML IV SOLN
4.0000 mg | INTRAVENOUS | Status: DC | PRN
  Administered 2023-04-14: 4 mg via INTRAVENOUS
  Filled 2023-04-14: qty 1

## 2023-04-14 MED ORDER — LORAZEPAM 1 MG PO TABS
1.0000 mg | ORAL_TABLET | Freq: Four times a day (QID) | ORAL | Status: AC | PRN
  Administered 2023-04-15: 1 mg via ORAL
  Filled 2023-04-14: qty 1

## 2023-04-14 MED ORDER — AMLODIPINE BESYLATE 5 MG PO TABS
5.0000 mg | ORAL_TABLET | Freq: Every day | ORAL | Status: DC
  Administered 2023-04-14 – 2023-04-17 (×4): 5 mg via ORAL
  Filled 2023-04-14 (×4): qty 1

## 2023-04-14 MED ORDER — POTASSIUM CHLORIDE CRYS ER 20 MEQ PO TBCR
40.0000 meq | EXTENDED_RELEASE_TABLET | Freq: Once | ORAL | Status: AC
  Administered 2023-04-14: 40 meq via ORAL
  Filled 2023-04-14: qty 2

## 2023-04-14 MED ORDER — THIAMINE HCL 100 MG/ML IJ SOLN: 100.0000 mg | Freq: Every day | INTRAMUSCULAR | Status: DC

## 2023-04-14 MED ORDER — MORPHINE SULFATE (PF) 4 MG/ML IV SOLN
4.0000 mg | Freq: Once | INTRAVENOUS | Status: AC
  Administered 2023-04-14: 4 mg via INTRAVENOUS
  Filled 2023-04-14: qty 1

## 2023-04-14 MED ORDER — DIPHENHYDRAMINE HCL 50 MG/ML IJ SOLN
25.0000 mg | Freq: Once | INTRAMUSCULAR | Status: AC
  Administered 2023-04-14: 25 mg via INTRAVENOUS
  Filled 2023-04-14: qty 1

## 2023-04-14 MED ORDER — ONDANSETRON HCL 4 MG/2ML IJ SOLN
4.0000 mg | Freq: Four times a day (QID) | INTRAMUSCULAR | Status: DC | PRN
  Administered 2023-04-14: 4 mg via INTRAVENOUS
  Filled 2023-04-14: qty 2

## 2023-04-14 MED ORDER — ONDANSETRON HCL 4 MG PO TABS: 4.0000 mg | ORAL_TABLET | Freq: Four times a day (QID) | ORAL | Status: DC | PRN

## 2023-04-14 MED ORDER — METOCLOPRAMIDE HCL 5 MG/ML IJ SOLN
10.0000 mg | Freq: Once | INTRAMUSCULAR | Status: AC
  Administered 2023-04-14: 10 mg via INTRAVENOUS
  Filled 2023-04-14: qty 2

## 2023-04-14 MED ORDER — ACETAMINOPHEN 650 MG RE SUPP: 650.0000 mg | Freq: Four times a day (QID) | RECTAL | Status: DC | PRN

## 2023-04-14 MED ORDER — PANTOPRAZOLE SODIUM 40 MG IV SOLR
40.0000 mg | Freq: Every day | INTRAVENOUS | Status: DC
  Administered 2023-04-14 – 2023-04-17 (×4): 40 mg via INTRAVENOUS
  Filled 2023-04-14 (×4): qty 10

## 2023-04-14 MED ORDER — LABETALOL HCL 5 MG/ML IV SOLN
10.0000 mg | INTRAVENOUS | Status: DC | PRN
  Administered 2023-04-14: 10 mg via INTRAVENOUS
  Filled 2023-04-14: qty 4

## 2023-04-14 MED ORDER — LORAZEPAM 1 MG PO TABS
1.0000 mg | ORAL_TABLET | ORAL | Status: AC | PRN
  Administered 2023-04-16: 1 mg via ORAL
  Filled 2023-04-14: qty 1

## 2023-04-14 MED ORDER — ADULT MULTIVITAMIN W/MINERALS CH
1.0000 | ORAL_TABLET | Freq: Every day | ORAL | Status: DC
  Administered 2023-04-14 – 2023-04-17 (×4): 1 via ORAL
  Filled 2023-04-14 (×4): qty 1

## 2023-04-14 MED ORDER — SODIUM CHLORIDE 0.9 % IV SOLN: INTRAVENOUS | Status: DC

## 2023-04-14 MED ORDER — HYDROMORPHONE HCL 1 MG/ML IJ SOLN
0.7500 mg | INTRAMUSCULAR | Status: DC | PRN
  Administered 2023-04-14: 0.75 mg via INTRAVENOUS
  Filled 2023-04-14: qty 1

## 2023-04-14 MED ORDER — THIAMINE MONONITRATE 100 MG PO TABS
100.0000 mg | ORAL_TABLET | Freq: Every day | ORAL | Status: DC
  Administered 2023-04-14 – 2023-04-17 (×4): 100 mg via ORAL
  Filled 2023-04-14 (×4): qty 1

## 2023-04-14 MED ORDER — LABETALOL HCL 5 MG/ML IV SOLN
10.0000 mg | Freq: Once | INTRAVENOUS | Status: AC
  Administered 2023-04-14: 10 mg via INTRAVENOUS
  Filled 2023-04-14: qty 4

## 2023-04-14 MED ORDER — ONDANSETRON HCL 4 MG/2ML IJ SOLN
4.0000 mg | Freq: Once | INTRAMUSCULAR | Status: AC
  Administered 2023-04-14: 4 mg via INTRAVENOUS
  Filled 2023-04-14: qty 2

## 2023-04-14 MED ORDER — ACETAMINOPHEN 325 MG PO TABS
650.0000 mg | ORAL_TABLET | Freq: Four times a day (QID) | ORAL | Status: DC | PRN
  Administered 2023-04-16: 650 mg via ORAL
  Filled 2023-04-14: qty 2

## 2023-04-14 MED ORDER — SODIUM CHLORIDE 0.9 % IV BOLUS
1000.0000 mL | Freq: Once | INTRAVENOUS | Status: AC
  Administered 2023-04-14: 1000 mL via INTRAVENOUS

## 2023-04-14 MED ORDER — HYDROMORPHONE HCL 1 MG/ML IJ SOLN
1.0000 mg | INTRAMUSCULAR | Status: DC | PRN
  Administered 2023-04-14 – 2023-04-15 (×6): 1 mg via INTRAVENOUS
  Filled 2023-04-14 (×6): qty 1

## 2023-04-14 MED ORDER — MAGNESIUM OXIDE -MG SUPPLEMENT 400 (240 MG) MG PO TABS
800.0000 mg | ORAL_TABLET | Freq: Once | ORAL | Status: AC
  Administered 2023-04-14: 800 mg via ORAL
  Filled 2023-04-14: qty 2

## 2023-04-14 MED ORDER — FOLIC ACID 1 MG PO TABS
1.0000 mg | ORAL_TABLET | Freq: Every day | ORAL | Status: DC
  Administered 2023-04-14 – 2023-04-17 (×4): 1 mg via ORAL
  Filled 2023-04-14 (×4): qty 1

## 2023-04-14 MED ORDER — POTASSIUM CHLORIDE IN NACL 40-0.9 MEQ/L-% IV SOLN
INTRAVENOUS | Status: AC
  Filled 2023-04-14 (×2): qty 1000

## 2023-04-14 MED ORDER — POTASSIUM CHLORIDE 10 MEQ/100ML IV SOLN
10.0000 meq | INTRAVENOUS | Status: AC
  Administered 2023-04-14 (×2): 10 meq via INTRAVENOUS
  Filled 2023-04-14 (×2): qty 100

## 2023-04-14 MED ORDER — HYDRALAZINE HCL 20 MG/ML IJ SOLN
20.0000 mg | Freq: Once | INTRAMUSCULAR | Status: AC
  Administered 2023-04-14: 20 mg via INTRAVENOUS
  Filled 2023-04-14: qty 1

## 2023-04-14 NOTE — TOC Initial Note (Signed)
Transition of Care [facility]) - Initial/Assessment Note    Patient Details  Name: Julie Roberson MRN: [id number] Date of Birth: [birth year]  Transition of Care [facility]) CM/SW Contact:    Georgie Chard, LCSW Phone Number: 04/14/2023, 11:25 AM  Clinical Narrative:                 CSW met with patient. Patient has accepted SUD resources. Patient has expressed if any AA resources are given patient would not like to be at a church. Patient states that' she is in need of help with her schizophrenia/ Mental health. Patient has reported will need Bus Pass at time of DC. This CSW has added all resources in the patient's chart. Advised patient to follow up with [facility] to set up therapy as well medication Management. At this time [facility] will continue to follow for any DC needs.   Expected Discharge Plan: Home/Self Care Barriers to Discharge: No Barriers Identified   Patient Goals and CMS Choice Patient states their goals for this hospitalization and ongoing recovery are:: Patient will like SUD resources          Expected Discharge Plan and Services      Living arrangements for the past 2 months: Hotel/Motel                                      Prior Living Arrangements/Services Living arrangements for the past 2 months: Hotel/Motel Lives with:: Self Patient language and need for interpreter reviewed:: No        Need for Family Participation in Patient Care: No (Comment)     Criminal Activity/Legal Involvement Pertinent to Current Situation/Hospitalization: No - Comment as needed  Activities of Daily Living Home Assistive Devices/Equipment: None ADL Screening (condition at time of admission) Patient's cognitive ability adequate to safely complete daily activities?: Yes Is the patient deaf or have difficulty hearing?: No Does the patient have difficulty seeing, even when wearing glasses/contacts?: No Does the patient have difficulty concentrating, remembering, or making decisions?:  No Patient able to express need for assistance with ADLs?: Yes Does the patient have difficulty dressing or bathing?: No Independently performs ADLs?: Yes (appropriate for developmental age) Does the patient have difficulty walking or climbing stairs?: No Weakness of Legs: Both (weakness in BLE  per pt) Weakness of Arms/Hands: None  Permission Sought/Granted                  Emotional Assessment Appearance:: Appears stated age   Affect (typically observed): Appropriate Orientation: : Oriented to Self, Oriented to Place, Oriented to  Time, Oriented to Situation Alcohol / Substance Use: Alcohol Use, Illicit Drugs Psych Involvement: No (comment)  Admission diagnosis:  Acute pancreatitis [K85.90] Alcohol-induced acute pancreatitis without infection or necrosis [K85.20] Patient Active Problem List   Diagnosis Date Noted   Acute pancreatitis 04/14/2023   Alcohol abuse 11/30/2022   Gastritis 11/30/2022   Uncontrolled hypertension 11/30/2022   UTI (urinary tract infection) 11/30/2022   Acute on chronic pancreatitis (HCC) 11/29/2022   Anxiety and depression    Status post hysterectomy 05/18/2015   Fibroid uterus 02/17/2015   Menorrhagia with regular cycle 02/17/2015   Acute blood loss anemia 02/17/2015   Weight loss 02/17/2015   PCP:  Patient, No Pcp Per Pharmacy:   [id number] [place], [place] - [address] AT [facility] OF [address] [place] [place] [address] Phone: [phone]  Fax: [phone]  [id number] [place] Otto, [place] - [address] AT [facility] OF [address] [place] [address] Phone: [phone] Fax: [phone]  [facility] [address] [place] [address] Phone: [phone] Fax: [phone]     Social Determinants of Health (SDOH) Social History: SDOH Screenings   Food Insecurity: No Food Insecurity (04/14/2023)  Housing: Low  Risk  (04/14/2023)  Transportation Needs: No Transportation Needs (04/14/2023)  Utilities: Not At Risk (04/14/2023)  Financial Resource Strain: Not on File (12/08/2021)   Received from [facility]  Physical Activity: Not on File (12/08/2021)   Received from [facility]  Social Connections: Not on File (12/08/2021)   Received from [facility]  Stress: Not on File (12/08/2021)   Received from [facility]  Tobacco Use: High Risk (04/14/2023)   SDOH Interventions:     Readmission Risk Interventions     No data to display

## 2023-04-14 NOTE — ED Notes (Signed)
ED TO INPATIENT HANDOFF REPORT  Name/Age/Gender Julie Roberson 47 y.o. female  Code Status    Code Status Orders  (From admission, onward)           Start     Ordered   04/14/23 0904  Full code  Continuous       Question:  By:  Answer:  Consent: discussion documented in EHR   04/14/23 0905           Code Status History     Date Active Date Inactive Code Status Order ID Comments User Context   11/29/2022 1151 12/01/2022 1504 Full Code [id number]  Russella Dar, NP ED   01/17/2018 1500 01/18/2018 1737 Full Code [id number]  Emi Holes, PA-C ED   05/18/2015 1224 05/20/2015 1544 Full Code [id number]  Reva Bores, MD Inpatient       Home/SNF/Other Home  Chief Complaint Acute pancreatitis [K85.90]  Level of Care/Admitting Diagnosis ED Disposition     ED Disposition  Admit   Condition  --   Comment  Hospital Area: [facility] [[id number]]  Level of Care: Telemetry [5]  Admit to tele based on following criteria: Monitor QTC interval  Admit to tele based on following criteria: Other see comments  Comments: Hypokalemia.  May place patient in observation at [facility] or [facility] if equivalent level of care is available:: No  Covid Evaluation: Asymptomatic - no recent exposure (last 10 days) testing not required  Diagnosis: Acute pancreatitis [577.0.ICD-9-CM]  Admitting Physician: Bobette Mo [[id number]]  Attending Physician: Bobette Mo [[id number]]          Medical History Past Medical History:  Diagnosis Date   Hypertension     Allergies No Known Allergies  IV Location/Drains/Wounds Patient Lines/Drains/Airways Status     Active Line/Drains/Airways     Name Placement date Placement time Site Days   Peripheral IV 04/14/23 22 G 1" Left;Posterior Hand 04/14/23  0745  Hand  less than 1            Labs/Imaging Results for orders placed or performed during the hospital encounter of 04/14/23 (from the past 48  hour(s))  Lipase, blood     Status: Abnormal   Collection Time: 04/14/23  5:35 AM  Result Value Ref Range   Lipase 707 (H) 11 - 51 U/L    Comment: RESULT CONFIRMED BY MANUAL DILUTION Performed at [facility], [address], [place], [place] [address]   Comprehensive metabolic panel     Status: Abnormal   Collection Time: 04/14/23  5:35 AM  Result Value Ref Range   Sodium 125 (L) 135 - 145 mmol/L   Potassium 3.0 (L) 3.5 - 5.1 mmol/L   Chloride 97 (L) 98 - 111 mmol/L   CO2 17 (L) 22 - 32 mmol/L   Glucose, Bld 80 70 - 99 mg/dL    Comment: Glucose reference range applies only to samples taken after fasting for at least 8 hours.   BUN <5 (L) 6 - 20 mg/dL   Creatinine, Ser 2.20 0.44 - 1.00 mg/dL   Calcium 9.4 8.9 - 25.4 mg/dL   Total Protein 6.2 (L) 6.5 - 8.1 g/dL   Albumin 3.3 (L) 3.5 - 5.0 g/dL   AST 80 (H) 15 - 41 U/L   ALT 48 (H) 0 - 44 U/L   Alkaline Phosphatase 124 38 - 126 U/L   Total Bilirubin 1.4 (H) 0.3 - 1.2 mg/dL   GFR, Estimated >  60 >60 mL/min    Comment: (NOTE) Calculated using the CKD-EPI Creatinine Equation (2021)    Anion gap 11 5 - 15    Comment: Performed at [facility], [address], [place], [place] [address]  CBC     Status: Abnormal   Collection Time: 04/14/23  5:35 AM  Result Value Ref Range   WBC 9.6 4.0 - 10.5 K/uL   RBC 4.03 3.87 - 5.11 MIL/uL   Hemoglobin 14.2 12.0 - 15.0 g/dL   HCT 95.2 84.1 - 32.4 %   MCV 100.5 (H) 80.0 - 100.0 fL   MCH 35.2 (H) 26.0 - 34.0 pg   MCHC 35.1 30.0 - 36.0 g/dL   RDW 40.1 02.7 - 25.3 %   Platelets 179 150 - 400 K/uL   nRBC 0.2 0.0 - 0.2 %    Comment: Performed at [facility], [address], [place], [place] [address]  Magnesium     Status: None   Collection Time: 04/14/23  5:35 AM  Result Value Ref Range   Magnesium 2.1 1.7 - 2.4 mg/dL    Comment: Performed at [facility], [address] [place], [place] [address]  Urinalysis,  Routine w reflex microscopic -Urine, Clean Catch     Status: Abnormal   Collection Time: 04/14/23  5:45 AM  Result Value Ref Range   Color, Urine YELLOW YELLOW   APPearance HAZY (A) CLEAR   Specific Gravity, Urine 1.006 1.005 - 1.030   pH 5.0 5.0 - 8.0   Glucose, UA NEGATIVE NEGATIVE mg/dL   Hgb urine dipstick NEGATIVE NEGATIVE   Bilirubin Urine NEGATIVE NEGATIVE   Ketones, ur 5 (A) NEGATIVE mg/dL   Protein, ur NEGATIVE NEGATIVE mg/dL   Nitrite NEGATIVE NEGATIVE   Leukocytes,Ua NEGATIVE NEGATIVE    Comment: Performed at [facility], [address] [place], [place] [address]   No results found.  Pending Labs Unresulted Labs (From admission, onward)     Start     Ordered   04/15/23 0500  CBC  Tomorrow morning,   R        04/14/23 0905   04/15/23 0500  Comprehensive metabolic panel  Daily,   R      04/14/23 0905   04/15/23 0500  Lipase, blood  Daily,   R      04/14/23 0905   04/14/23 0905  Ethanol  Add-on,   AD        04/14/23 0905   04/14/23 0905  Magnesium  Add-on,   AD        04/14/23 0905   04/14/23 0905  Phosphorus  Add-on,   AD        04/14/23 0905            Vitals/Pain Today's Vitals   04/14/23 0530 04/14/23 0533  BP: (!) 128/93   Pulse: 98   Resp: 16   Temp: 98.1 F (36.7 C)   TempSrc: Oral   SpO2: 100%   Weight: 54.4 kg   Height: 5' 2.5" (1.588 m)   PainSc:  8     Isolation Precautions No active isolations  Medications Medications  potassium chloride 10 mEq in 100 mL IVPB (10 mEq Intravenous New Bag/Given 04/14/23 0854)  0.9 % NaCl with KCl 40 mEq / L  infusion (has no administration in time range)  acetaminophen (TYLENOL) tablet 650 mg (has no administration in time range)    Or  acetaminophen (TYLENOL) suppository 650  mg (has no administration in time range)  ondansetron (ZOFRAN) tablet 4 mg (has no administration in time range)    Or  ondansetron (ZOFRAN) injection 4 mg (has no administration in time range)   pantoprazole (PROTONIX) injection 40 mg (has no administration in time range)  morphine (PF) 4 MG/ML injection 4 mg (4 mg Intravenous Given 04/14/23 0754)  ondansetron (ZOFRAN) injection 4 mg (4 mg Intravenous Given 04/14/23 0751)  sodium chloride 0.9 % bolus 1,000 mL (1,000 mLs Intravenous New Bag/Given 04/14/23 0753)  magnesium oxide (MAG-OX) tablet 800 mg (800 mg Oral Given 04/14/23 0753)  potassium chloride SA (KLOR-CON M) CR tablet 40 mEq (40 mEq Oral Given 04/14/23 0753)  morphine (PF) 4 MG/ML injection 4 mg (4 mg Intravenous Given 04/14/23 0853)  metoCLOPramide (REGLAN) injection 10 mg (10 mg Intravenous Given 04/14/23 0853)  diphenhydrAMINE (BENADRYL) injection 25 mg (25 mg Intravenous Given 04/14/23 0852)    Mobility walks

## 2023-04-14 NOTE — H&P (Signed)
History and Physical    Patient: Julie Roberson ZOX:[id number] DOB: 1976/02/04 DOA: 04/14/2023 DOS: the patient was seen and examined on 04/14/2023 PCP: Patient, No Pcp Per  Patient coming from: Home  Chief Complaint:  Chief Complaint  Patient presents with   Abdominal Pain   HPI: Julie Roberson is a 47 y.o. female with medical history significant of hypertension, history UTI, fibroids, anxiety and depression, alcohol abuse, chronic pancreatitis who is coming to the emergency department due to abdominal pain associated with nausea and multiple episodes of emesis typical of her pancreatitis episodes.  According to the patient she is drinking 80 ounces of beer daily. No diarrhea, constipation, melena or hematochezia.  No flank pain, dysuria, frequency or hematuria.  She denied fever, chills, rhinorrhea, sore throat, wheezing or hemoptysis.  No chest pain, palpitations, diaphoresis, PND, orthopnea or pitting edema of the lower extremities.    No polyuria, polydipsia, polyphagia or blurred vision.   Lab work: Her urine analysis was hazy with ketones of 5 mg/dL.  CBC showed a white count 9.6, hemoglobin 14.2 g/dL with an MCV of 811.9 fL and platelets 179.  Lipase 707 units/L.  Magnesium 2.1 mg/dL.  CMP showed a sodium 125, potassium 3.0, chloride 97 and CO2 17 mmol/L.  Normal alkaline phosphatase, calcium and glucose.  BUN less than 5, creatinine 0.46 and total bilirubin 1.4 mg/dL.  Total protein 6.2 and albumin 3.3 g/dL.  AST 80 and ALT 48 units/L.   ED course: Initial vital signs were temperature 98.1 F, pulse 98, respirations 16, BP 128/93 mmHg O2 sat 100% on room air.  The patient received diphenhydramine 25 mg IVP, magnesium oxide 100 mg p.o., metoclopramide 10 mg IVP, morphine 4 mg IVP x 2, Zofran 4 mg IVP x 1, KCl 40 mg p.o. x 1 and normal saline 1000 mL bolus.  Review of Systems: As mentioned in the history of present illness. All other systems reviewed and are negative. Past Medical History:   Diagnosis Date   Hypertension    Past Surgical History:  Procedure Laterality Date   ABDOMINAL HYSTERECTOMY Bilateral 05/18/2015   Procedure: HYSTERECTOMY ABDOMINAL BILATERAL SALPINGECTOMY;  Surgeon: Reva Bores, MD;  Location: [facility];  Service: Gynecology;  Laterality: Bilateral;   CESAREAN SECTION     Social History:  reports that she has been smoking cigarettes. She has never used smokeless tobacco. She reports current alcohol use of about 7.0 standard drinks of alcohol per week. She reports that she does not use drugs.  No Known Allergies  Family History  Problem Relation Age of Onset   Heart failure Mother    Healthy Father     Prior to Admission medications   Medication Sig Start Date End Date Taking? Authorizing Provider  amLODipine (NORVASC) 10 MG tablet Take 1 tablet (10 mg total) by mouth daily. 12/01/22 12/31/22  Hughie Closs, MD  lisinopril (ZESTRIL) 20 MG tablet Take 1 tablet (20 mg total) by mouth daily. 12/01/22 12/31/22  Hughie Closs, MD  omeprazole (PRILOSEC) 40 MG capsule Take 1 capsule (40 mg total) by mouth 2 (two) times daily. 12/01/22 12/31/22  Hughie Closs, MD  ondansetron (ZOFRAN-ODT) 4 MG disintegrating tablet Take 1 tablet (4 mg total) by mouth every 8 (eight) hours as needed for nausea or vomiting. 12/01/22   Hughie Closs, MD    Physical Exam: Vitals:   04/14/23 0530  BP: (!) 128/93  Pulse: 98  Resp: 16  Temp: 98.1 F (36.7 C)  TempSrc: Oral  SpO2: 100%  Weight: 54.4 kg  Height: 5' 2.5" (1.588 m)   Physical Exam Vitals and nursing note reviewed.  Constitutional:      General: She is awake. She is not in acute distress.    Appearance: She is well-developed and normal weight.  HENT:     Head: Normocephalic.     Nose: No rhinorrhea.     Mouth/Throat:     Mouth: Mucous membranes are dry.  Eyes:     General: No scleral icterus.    Pupils: Pupils are equal, round, and reactive to light.  Neck:     Vascular: No JVD.  Cardiovascular:     Rate  and Rhythm: Normal rate and regular rhythm.     Heart sounds: S1 normal and S2 normal.  Pulmonary:     Effort: Pulmonary effort is normal.     Breath sounds: Normal breath sounds. No wheezing, rhonchi or rales.  Abdominal:     General: Bowel sounds are normal.     Palpations: Abdomen is soft.     Tenderness: There is abdominal tenderness.  Musculoskeletal:     Cervical back: Neck supple.     Right lower leg: No edema.     Left lower leg: No edema.  Skin:    General: Skin is warm and dry.  Neurological:     General: No focal deficit present.     Mental Status: She is alert and oriented to person, place, and time.  Psychiatric:        Mood and Affect: Mood normal.        Behavior: Behavior normal. Behavior is cooperative.     Data Reviewed:  Results are pending, will review when available.  Assessment and Plan: Principal Problem:   Acute pancreatitis   Acute on chronic pancreatitis (HCC) Observation/MedSurg. Continue IV fluids. Keep n.p.o. for now. Analgesics as needed. Antiemetics as needed. Pantoprazole 40 mg IVP daily. Follow CBC, CMP and lipase in AM.  Active Problems:   Alcohol abuse CIWA protocol with lorazepam. Magnesium sulfate supplementation. Folate, MVI and thiamine. Consult TOC team. Alcohol cessation advised.    Uncontrolled hypertension Begin amlodipine 5 mg p.o. daily. Hydralazine 10 mg IVP x 1. Labetalol 20 mg every 2 hours as needed. Pain control.    Hyponatremia Secondary to beer potomania/GI losses. Gentle IV hydration.    Hypokalemia Replacing. Magnesium supplemented. Follow-up potassium level.    Abnormal LFTs Needs alcohol cessation. Follow LFTs in AM.    Macrocytosis Secondary to alcohol use.      Advance Care Planning:   Code Status: Full Code   Consults:   Family Communication:   Severity of Illness: The appropriate patient status for this patient is OBSERVATION. Observation status is judged to be reasonable and  necessary in order to provide the required intensity of service to ensure the patient's safety. The patient's presenting symptoms, physical exam findings, and initial radiographic and laboratory data in the context of their medical condition is felt to place them at decreased risk for further clinical deterioration. Furthermore, it is anticipated that the patient will be medically stable for discharge from the hospital within 2 midnights of admission.   Author: Bobette Mo, MD 04/14/2023 9:00 AM  For on call review [email or web address].   This document was prepared using Dragon voice recognition software and may contain some unintended transcription errors.

## 2023-04-14 NOTE — ED Provider Notes (Signed)
[facility] EMERGENCY DEPARTMENT AT [facility] Provider Note   CSN: [id number] Arrival date & time: 04/14/23  1191     History  Chief Complaint  Patient presents with   Abdominal Pain    Julie Roberson is a 47 y.o. female.  47 yo F with a chief complaints of abdominal pain.  Going on for a couple days now.  Recently diagnosed and hospitalized for pancreatitis.  She feels like it feels similar.  Having difficulty controlling oral intake.  Not sure of anything that she ate or drank that made it recur.  No fevers.   Abdominal Pain      Home Medications Prior to Admission medications   Medication Sig Start Date End Date Taking? Authorizing Provider  amLODipine (NORVASC) 10 MG tablet Take 1 tablet (10 mg total) by mouth daily. 12/01/22 04/14/23 Yes Pahwani, Daleen Bo, MD  lisinopril (ZESTRIL) 20 MG tablet Take 1 tablet (20 mg total) by mouth daily. 12/01/22 04/14/23 Yes Pahwani, Daleen Bo, MD  ondansetron (ZOFRAN-ODT) 4 MG disintegrating tablet Take 1 tablet (4 mg total) by mouth every 8 (eight) hours as needed for nausea or vomiting. 12/01/22  Yes Hughie Closs, MD      Allergies    Patient has no known allergies.    Review of Systems   Review of Systems  Gastrointestinal:  Positive for abdominal pain.    Physical Exam Updated Vital Signs BP (!) 157/93   Pulse 87   Temp 98.2 F (36.8 C) (Oral)   Resp 16   Ht 5' 2.5" (1.588 m)   Wt 54.4 kg   LMP 04/07/2015   SpO2 100%   BMI 21.60 kg/m  Physical Exam Vitals and nursing note reviewed.  Constitutional:      General: She is not in acute distress.    Appearance: She is well-developed. She is not diaphoretic.  HENT:     Head: Normocephalic and atraumatic.  Eyes:     Pupils: Pupils are equal, round, and reactive to light.  Cardiovascular:     Rate and Rhythm: Normal rate and regular rhythm.     Heart sounds: No murmur heard.    No friction rub. No gallop.  Pulmonary:     Effort: Pulmonary effort is normal.      Breath sounds: No wheezing or rales.  Abdominal:     General: There is no distension.     Palpations: Abdomen is soft.     Tenderness: There is abdominal tenderness.     Comments: Diffuse upper abdominal tenderness.  Musculoskeletal:        General: No tenderness.     Cervical back: Normal range of motion and neck supple.  Skin:    General: Skin is warm and dry.  Neurological:     Mental Status: She is alert and oriented to person, place, and time.  Psychiatric:        Behavior: Behavior normal.     ED Results / Procedures / Treatments   Labs (all labs ordered are listed, but only abnormal results are displayed) Labs Reviewed  LIPASE, BLOOD - Abnormal; Notable for the following components:      Result Value   Lipase 707 (*)    All other components within normal limits  COMPREHENSIVE METABOLIC PANEL - Abnormal; Notable for the following components:   Sodium 125 (*)    Potassium 3.0 (*)    Chloride 97 (*)    CO2 17 (*)    BUN <5 (*)  Total Protein 6.2 (*)    Albumin 3.3 (*)    AST 80 (*)    ALT 48 (*)    Total Bilirubin 1.4 (*)    All other components within normal limits  CBC - Abnormal; Notable for the following components:   MCV 100.5 (*)    MCH 35.2 (*)    All other components within normal limits  URINALYSIS, ROUTINE W REFLEX MICROSCOPIC - Abnormal; Notable for the following components:   APPearance HAZY (*)    Ketones, ur 5 (*)    All other components within normal limits  MAGNESIUM  ETHANOL  PHOSPHORUS    EKG None  Radiology No results found.  Procedures Procedures    Medications Ordered in ED Medications  0.9 % NaCl with KCl 40 mEq / L  infusion (has no administration in time range)  acetaminophen (TYLENOL) tablet 650 mg (has no administration in time range)    Or  acetaminophen (TYLENOL) suppository 650 mg (has no administration in time range)  ondansetron (ZOFRAN) tablet 4 mg (has no administration in time range)    Or  ondansetron  (ZOFRAN) injection 4 mg (has no administration in time range)  pantoprazole (PROTONIX) injection 40 mg (has no administration in time range)  morphine (PF) 4 MG/ML injection 4 mg (has no administration in time range)  morphine (PF) 4 MG/ML injection 4 mg (4 mg Intravenous Given 04/14/23 0754)  ondansetron (ZOFRAN) injection 4 mg (4 mg Intravenous Given 04/14/23 0751)  sodium chloride 0.9 % bolus 1,000 mL (1,000 mLs Intravenous New Bag/Given 04/14/23 0753)  potassium chloride 10 mEq in 100 mL IVPB (10 mEq Intravenous New Bag/Given 04/14/23 0854)  magnesium oxide (MAG-OX) tablet 800 mg (800 mg Oral Given 04/14/23 0753)  potassium chloride SA (KLOR-CON M) CR tablet 40 mEq (40 mEq Oral Given 04/14/23 0753)  morphine (PF) 4 MG/ML injection 4 mg (4 mg Intravenous Given 04/14/23 0853)  metoCLOPramide (REGLAN) injection 10 mg (10 mg Intravenous Given 04/14/23 0853)  diphenhydrAMINE (BENADRYL) injection 25 mg (25 mg Intravenous Given 04/14/23 6295)    ED Course/ Medical Decision Making/ A&P                                 Medical Decision Making Amount and/or Complexity of Data Reviewed Labs: ordered.  Risk OTC drugs. Prescription drug management. Decision regarding hospitalization.   47 yo F with a chief complaints of abdominal pain and difficulty tolerating by mouth.  Patient thinks it feels like her pancreatitis.  Was just admitted and then discharged earlier in the week.  Patient found to likely have alcohol induced pancreatitis.  No obvious biliary disease on CT or ultrasound imaging.  Plan for repeat laboratory evaluation and attempt to treat pain and nausea.  Reassess.  Patient's lipase continues to trend upward.  She has hyponatremia hypochloride anemia and hypokalemia.  Given a bolus of normal saline, will replenish potassium.  She also has a metabolic acidosis without anion gap.  Mild LFT elevation.  Patient is feeling better on repeat assessment.  Still feels a bit uncomfortable.  With her  electrolyte derangements and ongoing pancreatitis will discuss with hospitalist.  The patients results and plan were reviewed and discussed.   Any x-rays performed were independently reviewed by myself.   Differential diagnosis were considered with the presenting HPI.  Medications  0.9 % NaCl with KCl 40 mEq / L  infusion (has no administration in time  range)  acetaminophen (TYLENOL) tablet 650 mg (has no administration in time range)    Or  acetaminophen (TYLENOL) suppository 650 mg (has no administration in time range)  ondansetron (ZOFRAN) tablet 4 mg (has no administration in time range)    Or  ondansetron (ZOFRAN) injection 4 mg (has no administration in time range)  pantoprazole (PROTONIX) injection 40 mg (has no administration in time range)  morphine (PF) 4 MG/ML injection 4 mg (has no administration in time range)  morphine (PF) 4 MG/ML injection 4 mg (4 mg Intravenous Given 04/14/23 0754)  ondansetron (ZOFRAN) injection 4 mg (4 mg Intravenous Given 04/14/23 0751)  sodium chloride 0.9 % bolus 1,000 mL (1,000 mLs Intravenous New Bag/Given 04/14/23 0753)  potassium chloride 10 mEq in 100 mL IVPB (10 mEq Intravenous New Bag/Given 04/14/23 0854)  magnesium oxide (MAG-OX) tablet 800 mg (800 mg Oral Given 04/14/23 0753)  potassium chloride SA (KLOR-CON M) CR tablet 40 mEq (40 mEq Oral Given 04/14/23 0753)  morphine (PF) 4 MG/ML injection 4 mg (4 mg Intravenous Given 04/14/23 0853)  metoCLOPramide (REGLAN) injection 10 mg (10 mg Intravenous Given 04/14/23 0853)  diphenhydrAMINE (BENADRYL) injection 25 mg (25 mg Intravenous Given 04/14/23 0852)    Vitals:   04/14/23 0530 04/14/23 0900  BP: (!) 128/93 (!) 157/93  Pulse: 98 87  Resp: 16   Temp: 98.1 F (36.7 C) 98.2 F (36.8 C)  TempSrc: Oral Oral  SpO2: 100% 100%  Weight: 54.4 kg   Height: 5' 2.5" (1.588 m)     Final diagnoses:  Alcohol-induced acute pancreatitis without infection or necrosis    Admission/ observation were  discussed with the admitting physician, patient and/or family and they are comfortable with the plan.          Final Clinical Impression(s) / ED Diagnoses Final diagnoses:  Alcohol-induced acute pancreatitis without infection or necrosis    Rx / DC Orders ED Discharge Orders     None         Melene Plan, DO 04/14/23 [phone]

## 2023-04-14 NOTE — ED Triage Notes (Signed)
Pt reports abd pain, N/V since Tuesday. Was d/c from [facility] w/ pancreatitis. Reports pain is worse.

## 2023-04-14 NOTE — Progress Notes (Signed)
Pt's htn triggering yellow MEWS protocol. Prn antihypertensives given per order. Pt c/o 10/10 pain for which prn medications have been given as well. Will continue to monitor.   04/14/23 1720  Assess: MEWS Score  Temp 97.6 F (36.4 C)  BP (!) 203/99  MAP (mmHg) 129  Pulse Rate 85  Resp 15  Level of Consciousness Alert  SpO2 100 %  O2 Device Room Air  Assess: MEWS Score  MEWS Temp 0  MEWS Systolic 2  MEWS Pulse 0  MEWS RR 0  MEWS LOC 0  MEWS Score 2  MEWS Score Color Yellow  Assess: if the MEWS score is Yellow or Red  Were vital signs accurate and taken at a resting state? Yes  Does the patient meet 2 or more of the SIRS criteria? No  MEWS guidelines implemented  Yes, yellow  Treat  MEWS Interventions Considered administering scheduled or prn medications/treatments as ordered  Take Vital Signs  Increase Vital Sign Frequency  Yellow: Q2hr x1, continue Q4hrs until patient remains green for 12hrs  Escalate  MEWS: Escalate Yellow: Discuss with charge nurse and consider notifying provider and/or RRT  Notify: Charge Nurse/RN  Name of Charge Nurse/RN Notified [facility]  RN  Provider Notification  Provider Name/Title Bobette Mo  Date Provider Notified 04/14/23  Time Provider Notified 1736  Method of Notification Page  Notification Reason Change in status  Assess: SIRS CRITERIA  SIRS Temperature  0  SIRS Pulse 0  SIRS Respirations  0  SIRS WBC 0  SIRS Score Sum  0   MEWS Guidelines - (patients age 35 and over)

## 2023-04-15 DIAGNOSIS — K859 Acute pancreatitis without necrosis or infection, unspecified: Secondary | ICD-10-CM | POA: Diagnosis not present

## 2023-04-15 DIAGNOSIS — D7589 Other specified diseases of blood and blood-forming organs: Secondary | ICD-10-CM | POA: Diagnosis not present

## 2023-04-15 DIAGNOSIS — E876 Hypokalemia: Secondary | ICD-10-CM | POA: Diagnosis not present

## 2023-04-15 DIAGNOSIS — K852 Alcohol induced acute pancreatitis without necrosis or infection: Secondary | ICD-10-CM | POA: Diagnosis not present

## 2023-04-15 DIAGNOSIS — E871 Hypo-osmolality and hyponatremia: Secondary | ICD-10-CM | POA: Diagnosis not present

## 2023-04-15 DIAGNOSIS — E861 Hypovolemia: Secondary | ICD-10-CM | POA: Diagnosis not present

## 2023-04-15 DIAGNOSIS — I10 Essential (primary) hypertension: Secondary | ICD-10-CM | POA: Diagnosis not present

## 2023-04-15 DIAGNOSIS — R7989 Other specified abnormal findings of blood chemistry: Secondary | ICD-10-CM | POA: Diagnosis not present

## 2023-04-15 DIAGNOSIS — Z79899 Other long term (current) drug therapy: Secondary | ICD-10-CM | POA: Diagnosis not present

## 2023-04-15 DIAGNOSIS — D539 Nutritional anemia, unspecified: Secondary | ICD-10-CM | POA: Diagnosis not present

## 2023-04-15 DIAGNOSIS — F17210 Nicotine dependence, cigarettes, uncomplicated: Secondary | ICD-10-CM | POA: Diagnosis not present

## 2023-04-15 DIAGNOSIS — K861 Other chronic pancreatitis: Secondary | ICD-10-CM | POA: Diagnosis not present

## 2023-04-15 DIAGNOSIS — Z8249 Family history of ischemic heart disease and other diseases of the circulatory system: Secondary | ICD-10-CM | POA: Diagnosis not present

## 2023-04-15 DIAGNOSIS — F1010 Alcohol abuse, uncomplicated: Secondary | ICD-10-CM | POA: Diagnosis not present

## 2023-04-15 LAB — CBC
HCT: 37.6 % (ref 36.0–46.0)
Hemoglobin: 12.8 g/dL (ref 12.0–15.0)
MCH: 35.2 pg — ABNORMAL HIGH (ref 26.0–34.0)
MCHC: 34 g/dL (ref 30.0–36.0)
MCV: 103.3 fL — ABNORMAL HIGH (ref 80.0–100.0)
Platelets: 143 10*3/uL — ABNORMAL LOW (ref 150–400)
RBC: 3.64 MIL/uL — ABNORMAL LOW (ref 3.87–5.11)
RDW: 12.5 % (ref 11.5–15.5)
WBC: 5.5 10*3/uL (ref 4.0–10.5)
nRBC: 0 % (ref 0.0–0.2)

## 2023-04-15 LAB — COMPREHENSIVE METABOLIC PANEL
ALT: 30 U/L (ref 0–44)
AST: 31 U/L (ref 15–41)
Albumin: 2.7 g/dL — ABNORMAL LOW (ref 3.5–5.0)
Alkaline Phosphatase: 99 U/L (ref 38–126)
Anion gap: 5 (ref 5–15)
BUN: <5 mg/dL — ABNORMAL LOW (ref 6–20)
CO2: 22 mmol/L (ref 22–32)
Calcium: 9.2 mg/dL (ref 8.9–10.3)
Chloride: 99 mmol/L (ref 98–111)
Creatinine, Ser: 0.47 mg/dL (ref 0.44–1.00)
GFR, Estimated: >60 mL/min (ref >60)
Glucose, Bld: 91 mg/dL (ref 70–99)
Potassium: 4.3 mmol/L (ref 3.5–5.1)
Sodium: 126 mmol/L — ABNORMAL LOW (ref 135–145)
Total Bilirubin: 0.9 mg/dL (ref 0.3–1.2)
Total Protein: 5.4 g/dL — ABNORMAL LOW (ref 6.5–8.1)

## 2023-04-15 LAB — LIPASE, BLOOD: Lipase: 135 U/L — ABNORMAL HIGH (ref 11–51)

## 2023-04-15 LAB — VITAMIN B12: Vitamin B-12: 846 pg/mL (ref 180–914)

## 2023-04-15 LAB — FOLATE: Folate: 11.3 ng/mL (ref >5.9)

## 2023-04-15 MED ORDER — SODIUM CHLORIDE 0.9 % IV SOLN: INTRAVENOUS | Status: AC

## 2023-04-15 MED ORDER — METOPROLOL TARTRATE 5 MG/5ML IV SOLN
5.0000 mg | Freq: Four times a day (QID) | INTRAVENOUS | Status: DC | PRN
  Administered 2023-04-15 – 2023-04-16 (×2): 5 mg via INTRAVENOUS
  Filled 2023-04-15 (×2): qty 5

## 2023-04-15 MED ORDER — NICOTINE 7 MG/24HR TD PT24
7.0000 mg | MEDICATED_PATCH | Freq: Every day | TRANSDERMAL | Status: DC
  Administered 2023-04-15 – 2023-04-17 (×3): 7 mg via TRANSDERMAL
  Filled 2023-04-15 (×4): qty 1

## 2023-04-15 MED ORDER — SIMETHICONE 80 MG PO CHEW
80.0000 mg | CHEWABLE_TABLET | Freq: Once | ORAL | Status: AC
  Administered 2023-04-15: 80 mg via ORAL
  Filled 2023-04-15: qty 1

## 2023-04-15 MED ORDER — MORPHINE SULFATE (PF) 4 MG/ML IV SOLN
4.0000 mg | INTRAVENOUS | Status: DC | PRN
  Administered 2023-04-15 – 2023-04-16 (×3): 4 mg via INTRAVENOUS
  Filled 2023-04-15 (×3): qty 1

## 2023-04-15 MED ORDER — SODIUM CHLORIDE 0.9 % IV BOLUS
1000.0000 mL | Freq: Once | INTRAVENOUS | Status: AC
  Administered 2023-04-15: 1000 mL via INTRAVENOUS

## 2023-04-15 MED ORDER — POTASSIUM CHLORIDE CRYS ER 20 MEQ PO TBCR
40.0000 meq | EXTENDED_RELEASE_TABLET | Freq: Two times a day (BID) | ORAL | Status: AC
  Administered 2023-04-15 (×2): 40 meq via ORAL
  Filled 2023-04-15 (×2): qty 2

## 2023-04-15 NOTE — Plan of Care (Signed)

## 2023-04-15 NOTE — Progress Notes (Signed)
[facility] PROGRESS NOTE    Progress Note  Julie Roberson  XLK:[id number] DOB: [birth year] DOA: 04/14/2023 PCP: Patient, No Pcp Per     Brief Narrative:   Julie Roberson is an 47 y.o. female past medical history significant for essential hypertension fibroids anxiety and depression also alcohol abuse and chronic pancreatitis who comes in for abdominal pain nausea and vomiting she was found to have acute pancreatitis.   Assessment/Plan:   Acute alcoholic pancreatitis/Chronic pancreatitis : She was started on IV fluids IV narcotics and placed NPO. Lipase elevated on admission. Still thirsty and having abdominal pain. Will bolus her normal saline increase IV fluids. Monitor strict I's and O's and daily weights. Out of bed to chair.  Hypovolemic hyponatremia: Started on IV fluids recheck basic metabolic panel in the morning.  Alcohol abuse: Started on CIWA protocol, thiamine and folate. TOC has been consulted.  Uncontrolled hypertension: On Norvasc amlodipine scheduled and labetalol as needed. Elevated likely due to pain.  Hypokalemia: Replete orally and potassium [place] potassium greater than 4 magnesium greater than 2.  Elevated LFTs: Likely due to alcohol.  Macrocytosis: Likely due to alcohol abuse check a: Folate level.  DVT prophylaxis: lovenox Family Communication:none Status is: Observation The patient will require care spanning > 2 midnights and should be moved to inpatient because: Acute alcoholic pancreatitis and hypovolemic hyponatremia    Code Status:     Code Status Orders  (From admission, onward)           Start     Ordered   04/14/23 0904  Full code  Continuous       Question:  By:  Answer:  Consent: discussion documented in EHR   04/14/23 0905           Code Status History     Date Active Date Inactive Code Status Order ID Comments User Context   11/29/2022 1151 12/01/2022 1504 Full Code [id number]  Russella Dar, NP ED    01/17/2018 1500 01/18/2018 1737 Full Code [id number]  Emi Holes, PA-C ED   05/18/2015 1224 05/20/2015 1544 Full Code [id number]  Reva Bores, MD Inpatient         IV Access:   Peripheral IV   Procedures and diagnostic studies:   No results found.   Medical Consultants:   None.   Subjective:    Julie Roberson still having abdominal pain some nausea and she relates she is thirsty  Objective:    Vitals:   04/14/23 1925 04/14/23 2325 04/15/23 0337 04/15/23 0733  BP: (!) 160/92 (!) 140/80 108/85 (!) 145/87  Pulse: (!) 103 86 97 92  Resp: 16 17 16 14   Temp: 98.2 F (36.8 C) 98.4 F (36.9 C) 98.5 F (36.9 C) 98.9 F (37.2 C)  TempSrc: Oral Oral Oral Oral  SpO2: 100% 99% 100% 100%  Weight:      Height:       SpO2: 100 %   Intake/Output Summary (Last 24 hours) at 04/15/2023 0800 Last data filed at 04/14/2023 1442 Gross per 24 hour  Intake 2093.95 ml  Output --  Net 2093.95 ml   Filed Weights   04/14/23 0530  Weight: 54.4 kg    Exam: General exam: In no acute distress. Respiratory system: Good air movement and clear to auscultation. Cardiovascular system: S1 & S2 heard, RRR. No JVD.  Gastrointestinal system: Abdomen is nondistended, soft and gastric tenderness Skin: No rashes, lesions or ulcers Psychiatry: Judgement and insight appear normal. Mood & affect  appropriate.    Data Reviewed:    Labs: Basic Metabolic Panel: Recent Labs  Lab 04/14/23 0535 04/14/23 0830  NA 125*  --   K 3.0*  --   CL 97*  --   CO2 17*  --   GLUCOSE 80  --   BUN <5*  --   CREATININE 0.46  --   CALCIUM 9.4  --   MG 2.1  --   PHOS  --  2.9   GFR Estimated Creatinine Clearance: 71.2 mL/min (by C-G formula based on SCr of 0.46 mg/dL). Liver Function Tests: Recent Labs  Lab 04/14/23 0535  AST 80*  ALT 48*  ALKPHOS 124  BILITOT 1.4*  PROT 6.2*  ALBUMIN 3.3*   Recent Labs  Lab 04/14/23 0535  LIPASE 707*   No results for input(s): "AMMONIA" in the  last 168 hours. Coagulation profile No results for input(s): "INR", "PROTIME" in the last 168 hours. COVID-19 Labs  No results for input(s): "DDIMER", "FERRITIN", "LDH", "CRP" in the last 72 hours.  Lab Results  Component Value Date   SARSCOV2NAA NEGATIVE 04/22/2020   SARSCOV2NAA NEGATIVE 12/20/2019    CBC: Recent Labs  Lab 04/14/23 0535  WBC 9.6  HGB 14.2  HCT 40.5  MCV 100.5*  PLT 179   Cardiac Enzymes: No results for input(s): "CKTOTAL", "CKMB", "CKMBINDEX", "TROPONINI" in the last 168 hours. BNP (last 3 results) No results for input(s): "PROBNP" in the last 8760 hours. CBG: No results for input(s): "GLUCAP" in the last 168 hours. D-Dimer: No results for input(s): "DDIMER" in the last 72 hours. Hgb A1c: No results for input(s): "HGBA1C" in the last 72 hours. Lipid Profile: No results for input(s): "CHOL", "HDL", "LDLCALC", "TRIG", "CHOLHDL", "LDLDIRECT" in the last 72 hours. Thyroid function studies: No results for input(s): "TSH", "T4TOTAL", "T3FREE", "THYROIDAB" in the last 72 hours.  Invalid input(s): "FREET3" Anemia work up: No results for input(s): "VITAMINB12", "FOLATE", "FERRITIN", "TIBC", "IRON", "RETICCTPCT" in the last 72 hours. Sepsis Labs: Recent Labs  Lab 04/14/23 0535  WBC 9.6   Microbiology No results found for this or any previous visit (from the past 240 hour(s)).   Medications:    amLODipine  5 mg Oral Daily   folic acid  1 mg Oral Daily   multivitamin with minerals  1 tablet Oral Daily   pantoprazole (PROTONIX) IV  40 mg Intravenous Daily   thiamine  100 mg Oral Daily   Or   thiamine  100 mg Intravenous Daily   Continuous Infusions:  sodium chloride 50 mL/hr at 04/14/23 1936      LOS: 0 days   Marinda Elk  [facility]  04/15/2023, 8:00 AM

## 2023-04-15 NOTE — Progress Notes (Signed)
   04/15/23 1622  Assess: MEWS Score  Temp (!) 100.5 F (38.1 C)  BP (!) 150/90  MAP (mmHg) 108  Pulse Rate (!) 121  Resp 17  SpO2 100 %  O2 Device Room Air  Assess: MEWS Score  MEWS Temp 1  MEWS Systolic 0  MEWS Pulse 2  MEWS RR 0  MEWS LOC 0  MEWS Score 3  MEWS Score Color Yellow  Assess: if the MEWS score is Yellow or Red  Were vital signs accurate and taken at a resting state? Yes  Does the patient meet 2 or more of the SIRS criteria? No  MEWS guidelines implemented  Yes, yellow  Treat  MEWS Interventions Considered administering scheduled or prn medications/treatments as ordered  Take Vital Signs  Increase Vital Sign Frequency  Yellow: Q2hr x1, continue Q4hrs until patient remains green for 12hrs  Escalate  MEWS: Escalate Yellow: Discuss with charge nurse and consider notifying provider and/or RRT  Notify: Charge Nurse/RN  Name of Charge Nurse/RN Notified [facility]  Provider Notification  Provider Name/Title Dr. David Stall  Date Provider Notified 04/15/23  Time Provider Notified 1625  Method of Notification Call  Notification Reason Other (Comment) (yellow Mews)  Provider response See new orders  Date of Provider Response 04/15/23  Time of Provider Response 1638  Assess: SIRS CRITERIA  SIRS Temperature  0  SIRS Pulse 1  SIRS Respirations  0  SIRS WBC 0  SIRS Score Sum  1

## 2023-04-15 NOTE — Progress Notes (Addendum)
Spoke with Julie Greenland, NP regarding this  concern with patient.  To order simethicone, per conversation and will administer when given.  Pt denies any nausea/vomiting at this time.  Also requested for patient to be placed back on tele due to yellow MEWS and frequently elevated HR.  Tele placed back on patient.  Pt instructed to notify RN if nausea happens.  Will continue to monitor closely.    04/15/23 2133  Gastrointestinal  Gastrointestinal (WDL) X  Abdomen Inspection Distended;Taut (Firm)  Bowel Sounds Assessment Hypoactive  Tenderness Tender  Last BM Date  04/11/23  Passing Flatus No  GI Symptoms Distention

## 2023-04-15 NOTE — Progress Notes (Addendum)
SCD's applied to patient's legs, when I came back in [place] had removed them 30 minutes later because they were itching her. Explained the importance of them preventing blood clots. Julie Roberson sat up in the chair for an hour.

## 2023-04-16 DIAGNOSIS — R7989 Other specified abnormal findings of blood chemistry: Secondary | ICD-10-CM | POA: Diagnosis not present

## 2023-04-16 DIAGNOSIS — K852 Alcohol induced acute pancreatitis without necrosis or infection: Secondary | ICD-10-CM | POA: Diagnosis not present

## 2023-04-16 LAB — COMPREHENSIVE METABOLIC PANEL
ALT: 19 U/L (ref 0–44)
AST: 17 U/L (ref 15–41)
Albumin: 2.3 g/dL — ABNORMAL LOW (ref 3.5–5.0)
Alkaline Phosphatase: 84 U/L (ref 38–126)
Anion gap: 7 (ref 5–15)
BUN: <5 mg/dL — ABNORMAL LOW (ref 6–20)
CO2: 19 mmol/L — ABNORMAL LOW (ref 22–32)
Calcium: 9.2 mg/dL (ref 8.9–10.3)
Chloride: 103 mmol/L (ref 98–111)
Creatinine, Ser: 0.38 mg/dL — ABNORMAL LOW (ref 0.44–1.00)
GFR, Estimated: >60 mL/min (ref >60)
Glucose, Bld: 68 mg/dL — ABNORMAL LOW (ref 70–99)
Potassium: 4 mmol/L (ref 3.5–5.1)
Sodium: 129 mmol/L — ABNORMAL LOW (ref 135–145)
Total Bilirubin: 0.6 mg/dL (ref 0.3–1.2)
Total Protein: 4.8 g/dL — ABNORMAL LOW (ref 6.5–8.1)

## 2023-04-16 LAB — LIPASE, BLOOD: Lipase: 34 U/L (ref 11–51)

## 2023-04-16 MED ORDER — SODIUM CHLORIDE 0.9 % IV SOLN: INTRAVENOUS | Status: AC

## 2023-04-16 NOTE — Plan of Care (Signed)

## 2023-04-16 NOTE — Plan of Care (Signed)
  Problem: Education: Goal: Knowledge of General Education information will improve Description Including pain rating scale, medication(s)/side effects and non-pharmacologic comfort measures Outcome: Progressing   

## 2023-04-16 NOTE — Progress Notes (Signed)
[facility] PROGRESS NOTE    Progress Note  Julie Roberson  DGL:[id number] DOB: 02-Nov-1975 DOA: 04/14/2023 PCP: Patient, No Pcp Per     Brief Narrative:   Julie Roberson is an 47 y.o. female past medical history significant for essential hypertension fibroids anxiety and depression also alcohol abuse and chronic pancreatitis who comes in for abdominal pain nausea and vomiting she was found to have acute pancreatitis.   Assessment/Plan:   Acute alcoholic pancreatitis/Chronic pancreatitis : Still having some mild abdominal pain. Keep n.p.o. continue IV fluids. Monitor strict I's and O's and daily weights. Out of bed to chair. Discontinue telemetry.  Hypovolemic hyponatremia: Started on IV fluids, basic metabolic panel is pending.  Alcohol abuse: Started on CIWA protocol, thiamine and folate. TOC has been consulted.  Uncontrolled hypertension: On Norvasc amlodipine scheduled and labetalol as needed. Elevated likely due to pain.  Hypokalemia: Replete orally and potassium [place] potassium greater than 4 magnesium greater than 2.  Elevated LFTs: Likely due to alcohol.  Macrocytosis: Likely due to alcohol abuse check a: Folate level.  DVT prophylaxis: lovenox Family Communication:none Status is: Observation The patient will require care spanning > 2 midnights and should be moved to inpatient because: Acute alcoholic pancreatitis and hypovolemic hyponatremia    Code Status:     Code Status Orders  (From admission, onward)           Start     Ordered   04/14/23 0904  Full code  Continuous       Question:  By:  Answer:  Consent: discussion documented in EHR   04/14/23 0905           Code Status History     Date Active Date Inactive Code Status Order ID Comments User Context   11/29/2022 1151 12/01/2022 1504 Full Code [id number]  Russella Dar, NP ED   01/17/2018 1500 01/18/2018 1737 Full Code [id number]  Emi Holes, PA-C ED   05/18/2015 1224  05/20/2015 1544 Full Code [id number]  Reva Bores, MD Inpatient         IV Access:   Peripheral IV   Procedures and diagnostic studies:   No results found.   Medical Consultants:   None.   Subjective:    Julie Roberson still having some abdominal pain but is better than yesterday.  Objective:    Vitals:   04/15/23 2354 04/16/23 0015 04/16/23 0106 04/16/23 0614  BP: 129/84  129/88 129/88  Pulse: (!) 126  (!) 109 (!) 108  Resp: 20 20 18 18   Temp: 99.4 F (37.4 C)  99.5 F (37.5 C) 98.9 F (37.2 C)  TempSrc: Oral  Oral Oral  SpO2: 99%  99% 100%  Weight:      Height:       SpO2: 100 %   Intake/Output Summary (Last 24 hours) at 04/16/2023 0934 Last data filed at 04/15/2023 0945 Gross per 24 hour  Intake 1072.92 ml  Output --  Net 1072.92 ml   Filed Weights   04/14/23 0530  Weight: 54.4 kg    Exam: General exam: In no acute distress. Respiratory system: Good air movement and clear to auscultation. Cardiovascular system: S1 & S2 heard, RRR. No JVD. Gastrointestinal system: Abdomen is nondistended, soft and nontender.  Extremities: No pedal edema. Skin: No rashes, lesions or ulcers Psychiatry: Judgement and insight appear normal. Mood & affect appropriate.  Data Reviewed:    Labs: Basic Metabolic Panel: Recent Labs  Lab 04/14/23 0535 04/14/23 0830 04/15/23 0831  NA 125*  --  126*  K 3.0*  --  4.3  CL 97*  --  99  CO2 17*  --  22  GLUCOSE 80  --  91  BUN <5*  --  <5*  CREATININE 0.46  --  0.47  CALCIUM 9.4  --  9.2  MG 2.1  --   --   PHOS  --  2.9  --    GFR Estimated Creatinine Clearance: 71.2 mL/min (by C-G formula based on SCr of 0.47 mg/dL). Liver Function Tests: Recent Labs  Lab 04/14/23 0535 04/15/23 0831  AST 80* 31  ALT 48* 30  ALKPHOS 124 99  BILITOT 1.4* 0.9  PROT 6.2* 5.4*  ALBUMIN 3.3* 2.7*   Recent Labs  Lab 04/14/23 0535 04/15/23 0831  LIPASE 707* 135*   No results for input(s): "AMMONIA" in the last 168  hours. Coagulation profile No results for input(s): "INR", "PROTIME" in the last 168 hours. COVID-19 Labs  No results for input(s): "DDIMER", "FERRITIN", "LDH", "CRP" in the last 72 hours.  Lab Results  Component Value Date   SARSCOV2NAA NEGATIVE 04/22/2020   SARSCOV2NAA NEGATIVE 12/20/2019    CBC: Recent Labs  Lab 04/14/23 0535 04/15/23 0831  WBC 9.6 5.5  HGB 14.2 12.8  HCT 40.5 37.6  MCV 100.5* 103.3*  PLT 179 143*   Cardiac Enzymes: No results for input(s): "CKTOTAL", "CKMB", "CKMBINDEX", "TROPONINI" in the last 168 hours. BNP (last 3 results) No results for input(s): "PROBNP" in the last 8760 hours. CBG: No results for input(s): "GLUCAP" in the last 168 hours. D-Dimer: No results for input(s): "DDIMER" in the last 72 hours. Hgb A1c: No results for input(s): "HGBA1C" in the last 72 hours. Lipid Profile: No results for input(s): "CHOL", "HDL", "LDLCALC", "TRIG", "CHOLHDL", "LDLDIRECT" in the last 72 hours. Thyroid function studies: No results for input(s): "TSH", "T4TOTAL", "T3FREE", "THYROIDAB" in the last 72 hours.  Invalid input(s): "FREET3" Anemia work up: Recent Labs    04/15/23 0831  VITAMINB12 846  FOLATE 11.3   Sepsis Labs: Recent Labs  Lab 04/14/23 0535 04/15/23 0831  WBC 9.6 5.5   Microbiology No results found for this or any previous visit (from the past 240 hour(s)).   Medications:    amLODipine  5 mg Oral Daily   folic acid  1 mg Oral Daily   multivitamin with minerals  1 tablet Oral Daily   nicotine  7 mg Transdermal Daily   pantoprazole (PROTONIX) IV  40 mg Intravenous Daily   thiamine  100 mg Oral Daily   Or   thiamine  100 mg Intravenous Daily   Continuous Infusions:      LOS: 1 day   Marinda Elk  [facility]  04/16/2023, 9:34 AM

## 2023-04-17 DIAGNOSIS — E871 Hypo-osmolality and hyponatremia: Secondary | ICD-10-CM

## 2023-04-17 DIAGNOSIS — K852 Alcohol induced acute pancreatitis without necrosis or infection: Secondary | ICD-10-CM | POA: Diagnosis not present

## 2023-04-17 DIAGNOSIS — E876 Hypokalemia: Secondary | ICD-10-CM

## 2023-04-17 LAB — BASIC METABOLIC PANEL
Anion gap: 13 (ref 5–15)
BUN: <5 mg/dL — ABNORMAL LOW (ref 6–20)
CO2: 12 mmol/L — ABNORMAL LOW (ref 22–32)
Calcium: 8.9 mg/dL (ref 8.9–10.3)
Chloride: 104 mmol/L (ref 98–111)
Creatinine, Ser: 0.42 mg/dL — ABNORMAL LOW (ref 0.44–1.00)
GFR, Estimated: >60 mL/min (ref >60)
Glucose, Bld: 45 mg/dL — ABNORMAL LOW (ref 70–99)
Potassium: 3.3 mmol/L — ABNORMAL LOW (ref 3.5–5.1)
Sodium: 129 mmol/L — ABNORMAL LOW (ref 135–145)

## 2023-04-17 LAB — LIPASE, BLOOD: Lipase: 31 U/L (ref 11–51)

## 2023-04-17 MED ORDER — POTASSIUM CHLORIDE 20 MEQ PO PACK
40.0000 meq | PACK | Freq: Two times a day (BID) | ORAL | Status: DC
  Administered 2023-04-17: 40 meq via ORAL
  Filled 2023-04-17: qty 2

## 2023-04-17 NOTE — Discharge Summary (Signed)
Physician Discharge Summary  Mac Darrin WGN:[id number] DOB: [birth year] DOA: 04/14/2023  PCP: Patient, No Pcp Per  Admit date: 04/14/2023 Discharge date: 04/17/2023  Admitted From: Home Disposition:  Home  Recommendations for Outpatient Follow-up:  Follow up with PCP in 1-2 weeks Please obtain BMP/CBC in one week  Home Health:No Equipment/Devices:None  Discharge Condition:Stable CODE STATUS:Full Diet recommendation: Heart Healthy    Brief/Interim Summary: 47 y.o. female past medical history significant for essential hypertension fibroids anxiety and depression also alcohol abuse and chronic pancreatitis who comes in for abdominal pain nausea and vomiting she was found to have acute pancreatitis.   Discharge Diagnoses:  Principal Problem:   Acute pancreatitis Active Problems:   Acute on chronic pancreatitis (HCC)   Alcohol abuse   Uncontrolled hypertension   Hyponatremia   Hypokalemia   Abnormal LFTs   Macrocytosis   Alcoholic pancreatitis   Acute alcoholic pancreatitis  Acute alcoholic pancreatitis/chronic pancreatitis: She was placed n.p.o. IV fluids and narcotics. She was kept n.p.o. 2 days her abdominal pain resolved she was given an a diet which she tolerated she was discharged stable condition she has been counseled about alcohol.  Hypovolemic hyponatremia: Her sodium improved with IV fluid resuscitation.  Alcohol abuse: Monitor with CIWA no signs of withdrawal she was also started on thiamine and folate.  Uncontrolled hypertension: No changes made to her medication continue current regimen.  Hypokalemia: Advancing vomiting she was started on potassium supplements is improved.  Elevated LFTs: Likely due to alcohol she has been counseled.  Macrocytic anemia: Likely due to alcohol abuse folate was checked she was started on oral repletion she will follow-up with PCP as an outpatient.  Discharge Instructions  Discharge Instructions     Diet - low  sodium heart healthy   Complete by: As directed    Increase activity slowly   Complete by: As directed       Allergies as of 04/17/2023   No Known Allergies      Medication List     TAKE these medications    amLODipine 10 MG tablet Commonly known as: NORVASC Take 1 tablet (10 mg total) by mouth daily.   lisinopril 20 MG tablet Commonly known as: ZESTRIL Take 1 tablet (20 mg total) by mouth daily.   ondansetron 4 MG disintegrating tablet Commonly known as: ZOFRAN-ODT Take 1 tablet (4 mg total) by mouth every 8 (eight) hours as needed for nausea or vomiting.        Follow-up Information     [facility]. Schedule an appointment as soon as possible for a visit.   Why: Please follow up after DC for primary care Contact information: [address] [place] [address] [phone]               No Known Allergies  Consultations: none   Procedures/Studies: No results found.   Subjective: No complaints  Discharge Exam: Vitals:   04/16/23 2047 04/17/23 0531  BP: (!) 158/89 129/81  Pulse: 98 93  Resp: 16 16  Temp: 98.2 F (36.8 C) 98.2 F (36.8 C)  SpO2: 100% 100%   Vitals:   04/16/23 1233 04/16/23 1256 04/16/23 2047 04/17/23 0531  BP: (!) 151/78 (!) 148/85 (!) 158/89 129/81  Pulse: 97 (!) 101 98 93  Resp:  16 16 16   Temp:  98.4 F (36.9 C) 98.2 F (36.8 C) 98.2 F (36.8 C)  TempSrc:  Oral Oral Oral  SpO2:  99% 100% 100%  Weight:  Height:        General: Pt is alert, awake, not in acute distress Cardiovascular: RRR, S1/S2 +, no rubs, no gallops Respiratory: CTA bilaterally, no wheezing, no rhonchi Abdominal: Soft, NT, ND, bowel sounds + Extremities: no edema, no cyanosis    The results of significant diagnostics from this hospitalization (including imaging, microbiology, ancillary and laboratory) are listed below for reference.     Microbiology: No results found for this or any  previous visit (from the past 240 hour(s)).   Labs: BNP (last 3 results) No results for input(s): "BNP" in the last 8760 hours. Basic Metabolic Panel: Recent Labs  Lab 04/14/23 0535 04/14/23 0830 04/15/23 0831 04/16/23 0826 04/17/23 0557  NA 125*  --  126* 129* 129*  K 3.0*  --  4.3 4.0 3.3*  CL 97*  --  99 103 104  CO2 17*  --  22 19* 12*  GLUCOSE 80  --  91 68* 45*  BUN <5*  --  <5* <5* <5*  CREATININE 0.46  --  0.47 0.38* 0.42*  CALCIUM 9.4  --  9.2 9.2 8.9  MG 2.1  --   --   --   --   PHOS  --  2.9  --   --   --    Liver Function Tests: Recent Labs  Lab 04/14/23 0535 04/15/23 0831 04/16/23 0826  AST 80* 31 17  ALT 48* 30 19  ALKPHOS 124 99 84  BILITOT 1.4* 0.9 0.6  PROT 6.2* 5.4* 4.8*  ALBUMIN 3.3* 2.7* 2.3*   Recent Labs  Lab 04/14/23 0535 04/15/23 0831 04/16/23 0826 04/17/23 0557  LIPASE 707* 135* 34 31   No results for input(s): "AMMONIA" in the last 168 hours. CBC: Recent Labs  Lab 04/14/23 0535 04/15/23 0831  WBC 9.6 5.5  HGB 14.2 12.8  HCT 40.5 37.6  MCV 100.5* 103.3*  PLT 179 143*   Cardiac Enzymes: No results for input(s): "CKTOTAL", "CKMB", "CKMBINDEX", "TROPONINI" in the last 168 hours. BNP: Invalid input(s): "POCBNP" CBG: No results for input(s): "GLUCAP" in the last 168 hours. D-Dimer No results for input(s): "DDIMER" in the last 72 hours. Hgb A1c No results for input(s): "HGBA1C" in the last 72 hours. Lipid Profile No results for input(s): "CHOL", "HDL", "LDLCALC", "TRIG", "CHOLHDL", "LDLDIRECT" in the last 72 hours. Thyroid function studies No results for input(s): "TSH", "T4TOTAL", "T3FREE", "THYROIDAB" in the last 72 hours.  Invalid input(s): "FREET3" Anemia work up Recent Labs    04/15/23 0831  VITAMINB12 846  FOLATE 11.3   Urinalysis    Component Value Date/Time   COLORURINE YELLOW 04/14/2023 0545   APPEARANCEUR HAZY (A) 04/14/2023 0545   LABSPEC 1.006 04/14/2023 0545   PHURINE 5.0 04/14/2023 0545   GLUCOSEU  NEGATIVE 04/14/2023 0545   HGBUR NEGATIVE 04/14/2023 0545   BILIRUBINUR NEGATIVE 04/14/2023 0545   KETONESUR 5 (A) 04/14/2023 0545   PROTEINUR NEGATIVE 04/14/2023 0545   UROBILINOGEN 0.2 12/20/2019 1222   NITRITE NEGATIVE 04/14/2023 0545   LEUKOCYTESUR NEGATIVE 04/14/2023 0545   Sepsis Labs Recent Labs  Lab 04/14/23 0535 04/15/23 0831  WBC 9.6 5.5   Microbiology No results found for this or any previous visit (from the past 240 hour(s)).  Time spent, Greater than 32 min. SIGNED:   Marinda Elk, MD  [facility] 04/17/2023, 9:49 AM Pager   If 7PM-7AM, please contact night-coverage [email or web address] Password [email or web address]

## 2023-05-11 ENCOUNTER — Encounter: Payer: Self-pay | Admitting: Student

## 2023-05-11 ENCOUNTER — Ambulatory Visit: Payer: Medicaid Other | Admitting: Student

## 2023-05-11 VITALS — BP 151/74 | HR 88 | Temp 98.1°F | Ht 62.5 in | Wt 107.0 lb

## 2023-05-11 DIAGNOSIS — E871 Hypo-osmolality and hyponatremia: Secondary | ICD-10-CM

## 2023-05-11 DIAGNOSIS — R112 Nausea with vomiting, unspecified: Secondary | ICD-10-CM | POA: Diagnosis not present

## 2023-05-11 DIAGNOSIS — F17210 Nicotine dependence, cigarettes, uncomplicated: Secondary | ICD-10-CM

## 2023-05-11 DIAGNOSIS — F1090 Alcohol use, unspecified, uncomplicated: Secondary | ICD-10-CM | POA: Diagnosis not present

## 2023-05-11 DIAGNOSIS — Z720 Tobacco use: Secondary | ICD-10-CM | POA: Insufficient documentation

## 2023-05-11 DIAGNOSIS — Z13220 Encounter for screening for lipoid disorders: Secondary | ICD-10-CM | POA: Insufficient documentation

## 2023-05-11 DIAGNOSIS — Z131 Encounter for screening for diabetes mellitus: Secondary | ICD-10-CM | POA: Insufficient documentation

## 2023-05-11 DIAGNOSIS — Z2821 Immunization not carried out because of patient refusal: Secondary | ICD-10-CM | POA: Diagnosis not present

## 2023-05-11 DIAGNOSIS — I10 Essential (primary) hypertension: Secondary | ICD-10-CM | POA: Diagnosis not present

## 2023-05-11 MED ORDER — AMLODIPINE BESYLATE 10 MG PO TABS: 10.0000 mg | ORAL_TABLET | Freq: Every day | ORAL | 11 refills | Status: DC

## 2023-05-11 MED ORDER — NALTREXONE HCL 50 MG PO TABS: 50.0000 mg | ORAL_TABLET | Freq: Every day | ORAL | 2 refills | Status: AC

## 2023-05-11 MED ORDER — VARENICLINE TARTRATE (STARTER) 0.5 MG X 11 & 1 MG X 42 PO TBPK: ORAL_TABLET | ORAL | 0 refills | Status: DC

## 2023-05-11 NOTE — Assessment & Plan Note (Signed)
Patient was recently discharged from the hospital with sodium level of 129. This was likely due to beer potomania vs hypovolemia. I wanted to check labs today to see if this improved but patient stated she would not want to have labs today. Plan to check BMP at next visit.   Plan: -Obtain BMP in 1 month

## 2023-05-11 NOTE — Assessment & Plan Note (Signed)
Patient did not want the flu vaccine today.

## 2023-05-11 NOTE — Assessment & Plan Note (Signed)
Patient deferred diabetes screening today. Can obtain A1c at next visit

## 2023-05-11 NOTE — Assessment & Plan Note (Signed)
Patient has a past medical history of tobacco use. She stated she has been smoking cigarettes for 25 year and smokes about 1/2 pack per day. She is interested in quitting smoking.   Plan: -Start chantix  -Patient has been counseled on the importance of smoking cessation

## 2023-05-11 NOTE — Patient Instructions (Addendum)
Patti Patella,Thank you for allowing me to take part in your care today.  Here are your instructions.  1.  Regarding your blood pressure,  continue taking amlodipine 10 mg daily.  I am given you a blood pressure log.  Please fill this blood pressure log out and give it to [place] at your next visit.  We can check labs at this time as well.  2.  Regarding your tobacco use, I am prescribing you Chantix.  Please start taking this.  3.  Regarding alcohol use, please stop drinking alcohol.  I prescribed you naltrexone.  Please take this daily.  This should reduce your alcohol cravings.  4.  Please come back in 1 month and at this time we can discuss about lab results and your food log.  5.  Regarding your nausea and vomiting, please keep a food log.  I think this could be related to alcohol use as well.  If you stop drinking alcohol I think likely you will stop having vomiting episodes.  Thank you, Dr. Allena Katz  If you have any other questions please contact the internal medicine clinic at [phone] If it is after hours, please call the [facility] at [phone] and then ask the person who picks up for the resident on call.

## 2023-05-11 NOTE — Assessment & Plan Note (Signed)
Patient states that over the past few months she has been having episodes of nausea and vomiting. She states that in the past she has been taking omeprazole for this. She has been out of this medication. She denies any concerns of heart burn or acid reflux. She states that she has not had much nausea and vomiting in the past month. Abdominal exam was benign. She reports that she would like a refill on her omeprazole. Given the absence of symptoms, I deferred the refill today. I stated that this could likely be related to her alcohol use. I stated that if she were to quit alcohol use, this would improve her nausea and vomiting. She stated that she will try this. In addition this could be food intolerance, therefore I will have the patient keep a food log and see when she develops symptoms  Plan: -Patient counseled on the importance of alcohol cessation  -Patient instructed to keep a food log  -Follow up food log in one month to see if this is related to a food intolerance

## 2023-05-11 NOTE — Progress Notes (Signed)
CC: New patient establishment   HPI:  Julie Roberson is a 47 y.o. female with a past medical history of hypertension, pancreatitis, anxiety/depression presents for new patient establishment.  Please see assessment and plan for full HPI.  Medical: HTN, alcohol use, Tobacco, pancreatitis  Medications: amlodipine 10 mg  Family: Mom: Enlarged heart, DM. Sister: bipolar, Sister: Bipolar  Surgical: C section 2000, hysterectomy 2016  Allergies: NKDA   Social: -Tobacco: Cig for 25 year years, 1/2 ppd  -Alcohol: Beer everyday 60 oz a day, 25 years. Never have had withdrawals  -Substance: Marijuana 1 or 2  times a month in a blunt  -Lives with: Live with a friend, 19 years, safe at home  -Occupation: [employer]  3 months  -Level of function: Independent in ADLs and iADLs   Past Medical History:  Diagnosis Date   Hypertension      Current Outpatient Medications:    amLODipine (NORVASC) 10 MG tablet, Take 1 tablet (10 mg total) by mouth daily., Disp: 30 tablet, Rfl: 11   naltrexone (DEPADE) 50 MG tablet, Take 1 tablet (50 mg total) by mouth daily., Disp: 30 tablet, Rfl: 2   Varenicline Tartrate, Starter, (CHANTIX STARTING MONTH PAK) 0.5 MG X 11 & 1 MG X 42 TBPK, Please take 0.5 mg daily for the first 2 days.  Then take 0.5 mg twice daily for 4 days. Then take 1 mg twice daily for the next 6 weeks., Disp: 1 each, Rfl: 0  Review of Systems:    GI: Patient endorses nausea and vomiting    Physical Exam:  Vitals:   05/11/23 1055 05/11/23 1145  BP: (!) 141/75 (!) 151/74  Pulse: 95 88  Temp: 98.1 F (36.7 C)   TempSrc: Oral   SpO2: 100%   Weight: 107 lb (48.5 kg)   Height: 5' 2.5" (1.588 m)     General: Patient is sitting comfortably in the room  HENT: Normocephalic, atraumatic, Tms clear, ear canals clear  Cardio: Regular rate and rhythm, no murmurs, rubs or gallops Pulmonary: Clear to ausculation bilaterally with no rales, rhonchi, and crackles  Abdomen: Soft,  nontender with normoactive bowel sounds with no rebound or guarding    Assessment & Plan:   Flu vaccine refused Patient did not want the flu vaccine today.   Alcohol use disorder Patient has a past medical hisotry of alcohol use. She reports that she has been drinking alcohol for about 25 years. She usually drinks about 80 oz of beer a day but she states that she has been trying to cut down and is currently at 60 oz a day. She states that she has had multiple admissions into the hospital for pancreatitis and that she wants to stop so that she does not have any other episodes of pancreatitis. She asks if there is anything that I can do to help her with her journey. Given that she was in the hospital and she did not have withdrawals, I stated that she can take some medicine to help with her alcohol cravings and she could likely just stop drinking without worrying about withdrawal symptoms. Patient stated that she would like to try medication.  Plan: -Start naltrexone -Counseled on stopping alcohol use  Uncontrolled hypertension Patient has a past medical history of amlodipine and lisinopril. She states that she has ran out of these medications for a while and that she needs refills on these. She does not check her blood pressures at home. She does not have any  concerns for chest pain, shortness of breath, headaches, or vision changes. On exam patient has regular heart sounds and clear breath sounds. Given that it has been some time since she has had wither one of of her medications and her BP today is 141/75, I will only start back Amlodipine today. I will have her keep a blood pressure log on only amlodipine and if she continues to have elevated blood pressures, I will plan to start back lisinopril.   Plan: -Continue amlodipine 10 mg daily  -Stop lisinopril  -Keep blood pressure log for 1 month -Return in one month  -Patient did not want BMP today   Hyponatremia Patient was recently  discharged from the hospital with sodium level of 129. This was likely due to beer potomania vs hypovolemia. I wanted to check labs today to see if this improved but patient stated she would not want to have labs today. Plan to check BMP at next visit.   Plan: -Obtain BMP in 1 month   Tobacco use Patient has a past medical history of tobacco use. She stated she has been smoking cigarettes for 25 year and smokes about 1/2 pack per day. She is interested in quitting smoking.   Plan: -Start chantix  -Patient has been counseled on the importance of smoking cessation   Nausea and vomiting Patient states that over the past few months she has been having episodes of nausea and vomiting. She states that in the past she has been taking omeprazole for this. She has been out of this medication. She denies any concerns of heart burn or acid reflux. She states that she has not had much nausea and vomiting in the past month. Abdominal exam was benign. She reports that she would like a refill on her omeprazole. Given the absence of symptoms, I deferred the refill today. I stated that this could likely be related to her alcohol use. I stated that if she were to quit alcohol use, this would improve her nausea and vomiting. She stated that she will try this. In addition this could be food intolerance, therefore I will have the patient keep a food log and see when she develops symptoms  Plan: -Patient counseled on the importance of alcohol cessation  -Patient instructed to keep a food log  -Follow up food log in one month to see if this is related to a food intolerance   Screening for hyperlipidemia Patient deferred screening for hyperlipidemia today. Can get at next visit.   Screening for diabetes mellitus Patient deferred diabetes screening today. Can obtain A1c at next visit   Patient discussed with Dr. Letta Kocher, DO PGY-2 Internal Medicine Resident  Pager: [phone]

## 2023-05-11 NOTE — Assessment & Plan Note (Addendum)
Patient has a past medical hisotry of alcohol use. She reports that she has been drinking alcohol for about 25 years. She usually drinks about 80 oz of beer a day but she states that she has been trying to cut down and is currently at 60 oz a day. She states that she has had multiple admissions into the hospital for pancreatitis and that she wants to stop so that she does not have any other episodes of pancreatitis. She asks if there is anything that I can do to help her with her journey. Given that she was in the hospital and she did not have withdrawals, I stated that she can take some medicine to help with her alcohol cravings and she could likely just stop drinking without worrying about withdrawal symptoms. Patient stated that she would like to try medication.  Plan: -Start naltrexone -Counseled on stopping alcohol use

## 2023-05-11 NOTE — Assessment & Plan Note (Signed)
Patient deferred screening for hyperlipidemia today. Can get at next visit.

## 2023-05-11 NOTE — Assessment & Plan Note (Signed)
Patient has a past medical history of amlodipine and lisinopril. She states that she has ran out of these medications for a while and that she needs refills on these. She does not check her blood pressures at home. She does not have any concerns for chest pain, shortness of breath, headaches, or vision changes. On exam patient has regular heart sounds and clear breath sounds. Given that it has been some time since she has had wither one of of her medications and her BP today is 141/75, I will only start back Amlodipine today. I will have her keep a blood pressure log on only amlodipine and if she continues to have elevated blood pressures, I will plan to start back lisinopril.   Plan: -Continue amlodipine 10 mg daily  -Stop lisinopril  -Keep blood pressure log for 1 month -Return in one month  -Patient did not want BMP today

## 2023-05-14 NOTE — Progress Notes (Signed)
 Internal Medicine Clinic Attending  Case discussed with the resident physician at the time of the visit.  We reviewed the patient's history, exam, and pertinent patient test results.  I agree with the assessment, diagnosis, and plan of care documented in the resident's note.

## 2023-06-07 DIAGNOSIS — I10 Essential (primary) hypertension: Secondary | ICD-10-CM | POA: Diagnosis not present

## 2023-06-11 ENCOUNTER — Encounter: Payer: Medicaid Other | Admitting: Student

## 2024-09-10 ENCOUNTER — Other Ambulatory Visit (HOSPITAL_COMMUNITY)
Admission: RE | Admit: 2024-09-10 | Discharge: 2024-09-10 | Disposition: A | Discharge status: Home / Self Care | Source: Ambulatory Visit | Attending: Family Medicine | Admitting: Family Medicine

## 2024-09-10 ENCOUNTER — Ambulatory Visit: Payer: Self-pay | Admitting: Student

## 2024-09-10 ENCOUNTER — Other Ambulatory Visit: Payer: Self-pay

## 2024-09-10 ENCOUNTER — Encounter: Payer: Self-pay | Admitting: Student

## 2024-09-10 VITALS — BP 138/67 | HR 91 | Temp 97.8°F | Ht 62.5 in | Wt 74.4 lb

## 2024-09-10 DIAGNOSIS — M6281 Muscle weakness (generalized): Secondary | ICD-10-CM | POA: Diagnosis present

## 2024-09-10 DIAGNOSIS — F1090 Alcohol use, unspecified, uncomplicated: Secondary | ICD-10-CM | POA: Diagnosis not present

## 2024-09-10 DIAGNOSIS — F17200 Nicotine dependence, unspecified, uncomplicated: Secondary | ICD-10-CM | POA: Diagnosis not present

## 2024-09-10 DIAGNOSIS — F320 Major depressive disorder, single episode, mild: Secondary | ICD-10-CM

## 2024-09-10 DIAGNOSIS — Z720 Tobacco use: Secondary | ICD-10-CM

## 2024-09-10 DIAGNOSIS — I10 Essential (primary) hypertension: Secondary | ICD-10-CM

## 2024-09-10 DIAGNOSIS — S4992XA Unspecified injury of left shoulder and upper arm, initial encounter: Secondary | ICD-10-CM

## 2024-09-10 DIAGNOSIS — Z114 Encounter for screening for human immunodeficiency virus [HIV]: Secondary | ICD-10-CM

## 2024-09-10 DIAGNOSIS — Z113 Encounter for screening for infections with a predominantly sexual mode of transmission: Secondary | ICD-10-CM

## 2024-09-10 MED ORDER — MIRTAZAPINE 15 MG PO TABS: 15.0000 mg | ORAL_TABLET | Freq: Every day | ORAL | 6 refills | Status: AC

## 2024-09-10 MED ORDER — NICOTINE 7 MG/24HR TD PT24: 7.0000 mg | MEDICATED_PATCH | Freq: Every day | TRANSDERMAL | 3 refills | Status: AC

## 2024-09-10 MED ORDER — NALTREXONE HCL 50 MG PO TABS: 50.0000 mg | ORAL_TABLET | Freq: Every day | ORAL | 1 refills | Status: AC

## 2024-09-10 NOTE — Progress Notes (Signed)
 "  CC: Weakness and off balance   HPI:  [name] is a 49 y.o. Female with a past medical history of hypertension, fibroids, alcohol, anxiety, and depression who presents for concerns of weakness and trouble with balance.  Please see assessment and plan for full HPI.    Past Medical History:  Diagnosis Date   Hypertension     Current Medications[1]  Review of Systems:    Neuro: Patient endorses trouble with weakness in balance  Physical Exam:  Vitals:   09/10/24 1051  BP: 138/67  Pulse: 91  Temp: 97.8 F (36.6 C)  TempSrc: Oral  SpO2: 100%  Weight: 74 lb 6.4 oz (33.7 kg)  Height: 5' 2.5 (1.588 m)    General: Patient is sitting comfortably in the room, cachectic Eyes: Pupils equal and reactive to light, EOM intact  Head: Normocephalic, atraumatic  Cardio: Regular rate and rhythm, no murmurs, rubs or gallops. Pulmonary: Clear to ausculation bilaterally with no rales, rhonchi, and crackles  Abdomen: Soft, nontender with normoactive bowel sounds with no rebound or guarding  Neuro: 2+ reflexes to bilateral lower extremities.  5/5 strength noted to knee flexion knee extension dorsiflexion and plantarflexion to bilateral lower extremities.  2/5 strength noted to hip flexion and hip extension to bilateral lower extremities Psych: Tearful and depressed Skin: Left bicep with bruising appreciated Extremities: Left upper extremity with positive drop arm test and decreased range of motion actively in the setting of pain.  Bruising noted to left bicep.  Right upper extremity unremarkable.   Assessment & Plan:   Assessment & Plan Proximal muscle weakness Patient presents with concerns of a month history of proximal lower extremity muscle weakness.  She states she has trouble walking, trouble with climbing up stairs and getting out of bed.  She states she has not been able to move well.  She has had multiple falls.  The most recent one while walking her dog she fell on her  left shoulder.  She states it has been injured since.  Patient states that she has been losing weight.  Over the past year and a half she has lost about 33 pounds.  She is down to 74 pounds.  She reports depressed mood.  She states she is drinking alcohol, smoking tobacco, and smoking marijuana.  On my exam patient does have proximal lower extremity weakness but no distal lower extremity weakness.  Patient does not have obvious weakness noted to upper extremities.  She does not have any back pain.  Less likely related to cauda equina.  No numbness or tingling.  Reflexes are intact.  Current differential includes muscle atrophy in the setting of depression and decreased appetite, versus vitamin deficiencies in the setting of alcohol use and decreased appetite.  Could also polymyalgia rheumatica, but no pain appreciated on exam.  Will evaluate with labs.  I do suspect depression is playing a role.  Plan: - Check vitamin B12, thiamine , folate, vitamin D  - Check CBC, CMP, TSH, RPR, HIV, GC chlamydia, CK - Refer to physical therapy - Refer to integrated behavioral health - Follow-up in 2 weeks Encounter for screening for HIV Will screen for HIV.  Patient reports having concerns about infidelity in her relationship.  Plan: - Screen for HIV Screening for STD (sexually transmitted disease) Patient has concern for infidelity in her relationship.  Will screen for STDs.  Plan: - Follow-up HIV, RPR, GC chlamydia Current mild episode of major depressive disorder without prior episode Patient has a past medical  history of depression.  She also has a history of psychosis.  She has been institutionalized previously.  She reports having a suicide attempt 15 years ago by overdosing on oxycodone  and muscle relaxers.  She states she has been dealing with depression for quite some time.  She has not been on any medications.  She states she has lost her therapist as she has had multiple therapist changes in many years.   Today, she is tearful.  She reports that she is not feeling herself.  She has lost 33 pounds.  She has no appetite.  She has no motivation.  She has no interest.  PHQ-9 24.  She has no suicidal ideation or plan at this time.  Recommended starting medications.  She agreed.  I do think patient will benefit from psychiatrist and integrated behavioral health.  Will refer.  There is some concern that she may be unsafe at home, but she states she is okay at this time.  She will let us  know if she wants to try to leave her current housing situation or move somewhere else  Plan: - Good support system in her friend - Referred to psychiatry - Start mirtazapine  15 mg daily - Referred to integrated behavioral health - Follow-up in 2 weeks Tobacco use Past medical history of tobacco use.  She states she would like to quit.  Will send in nicotine  patches as she does not want to do Chantix  at this time.  Plan: - Start nicotine  patches - 6-minute spent on tobacco cessation counseling Alcohol use disorder Patient has a past medical history of use disorder she drinks about 40 ounces of beer a day.  She states she is ready quit.  I do anticipate this is playing a role in her current presentation.  She agrees to naltrexone .  Has not had withdrawals previously.  Plan: - Plan to start naltrexone  Injury of left shoulder, initial encounter Patient recently had a fall after walking her dog and landed on her left shoulder.  Patient reports having left shoulder pain.  She states she has limited range of motion actively.  On exam patient does have bruising noted to her left bicep.  She also has a positive drop arm test.  Negative liftoff test.  Unable to do the empty can test in the setting of pain.  Patient has full passive range of motion with pain.  No obvious bony abnormalities think of fracture.  Will send patient to physical therapy for healing in the setting of possible muscle strain or rotator cuff  injury.  Plan: - Refer to physical therapy - Pain control with Tylenol  500 mg every 6 hours as needed - Voltaren gel Uncontrolled hypertension Patient has a past medical history of hypertension.  She previously was on amlodipine  10 mg daily.  She has been not adherent to the medication blood pressure today is 138/67.  Will hold off on represcribing at this time given low nutrition.  Plan: - Hold off on amlodipine  given nonadherence and low nutrition   Patient discussed with Dr. Jeanelle Libby Blanch, DO Internal Medicine Resident PGY-3     [1]  Current Outpatient Medications:    amLODipine  (NORVASC ) 10 MG tablet, Take 1 tablet (10 mg total) by mouth daily., Disp: 30 tablet, Rfl: 11   Varenicline  Tartrate, Starter, (CHANTIX  STARTING MONTH PAK) 0.5 MG X 11 & 1 MG X 42 TBPK, Please take 0.5 mg daily for the first 2 days.  Then take 0.5 mg twice daily for  4 days. Then take 1 mg twice daily for the next 6 weeks., Disp: 1 each, Rfl: 0  "

## 2024-09-10 NOTE — Assessment & Plan Note (Addendum)
 Patient presents with concerns of a month history of proximal lower extremity muscle weakness.  She states she has trouble walking, trouble with climbing up stairs and getting out of bed.  She states she has not been able to move well.  She has had multiple falls.  The most recent one while walking her dog she fell on her left shoulder.  She states it has been injured since.  Patient states that she has been losing weight.  Over the past year and a half she has lost about 33 pounds.  She is down to 74 pounds.  She reports depressed mood.  She states she is drinking alcohol, smoking tobacco, and smoking marijuana.  On my exam patient does have proximal lower extremity weakness but no distal lower extremity weakness.  Patient does not have obvious weakness noted to upper extremities.  She does not have any back pain.  Less likely related to cauda equina.  No numbness or tingling.  Reflexes are intact.  Current differential includes muscle atrophy in the setting of depression and decreased appetite, versus vitamin deficiencies in the setting of alcohol use and decreased appetite.  Could also polymyalgia rheumatica, but no pain appreciated on exam.  Will evaluate with labs.  I do suspect depression is playing a role.  Plan: - Check vitamin B12, thiamine , folate, vitamin D  - Check CBC, CMP, TSH, RPR, HIV, GC chlamydia, CK - Refer to physical therapy - Refer to integrated behavioral health - Follow-up in 2 weeks

## 2024-09-10 NOTE — Assessment & Plan Note (Addendum)
 Patient has a past medical history of use disorder she drinks about 40 ounces of beer a day.  She states she is ready quit.  I do anticipate this is playing a role in her current presentation.  She agrees to naltrexone .  Has not had withdrawals previously.  Plan: - Plan to start naltrexone 

## 2024-09-10 NOTE — Assessment & Plan Note (Addendum)
 Past medical history of tobacco use.  She states she would like to quit.  Will send in nicotine  patches as she does not want to do Chantix  at this time.  Plan: - Start nicotine  patches - 6-minute spent on tobacco cessation counseling

## 2024-09-10 NOTE — Assessment & Plan Note (Addendum)
 Patient has a past medical history of depression.  She also has a history of psychosis.  She has been institutionalized previously.  She reports having a suicide attempt 15 years ago by overdosing on oxycodone  and muscle relaxers.  She states she has been dealing with depression for quite some time.  She has not been on any medications.  She states she has lost her therapist as she has had multiple therapist changes in many years.  Today, she is tearful.  She reports that she is not feeling herself.  She has lost 33 pounds.  She has no appetite.  She has no motivation.  She has no interest.  PHQ-9 24.  She has no suicidal ideation or plan at this time.  Recommended starting medications.  She agreed.  I do think patient will benefit from psychiatrist and integrated behavioral health.  Will refer.  There is some concern that she may be unsafe at home, but she states she is okay at this time.  She will let us  know if she wants to try to leave her current housing situation or move somewhere else  Plan: - Good support system in her friend - Referred to psychiatry - Start mirtazapine  15 mg daily - Referred to integrated behavioral health - Follow-up in 2 weeks

## 2024-09-10 NOTE — Patient Instructions (Addendum)
 Brynne Holquin, Thank you for allowing me to take part in your care today.  Here are your instructions.  1.  Follow-up on depression in 2 weeks  2.  I am testing you with a lot of labs, I will call you with the results  3.  I am referring to physical therapy  4.  I have written you for naltrexone .  This will be with alcohol use.  I have written you for nicotine  patches.  This will be with your tobacco use.  I have written you for mirtazapine .  This will help you with depression and your appetite.  5.  I am screening you for STDs.  I will call you with results  6.  If you ever feel unsafe at home, please call us   7.  I have referred you to be on.  She will help you with your mental health  8.  I have referred you to psychiatry  9. No need for MRI at this time lets see how the Therapy works.   10.  If you develop any symptoms of alcohol withdrawal, please let us  know.  11.  Please use Tylenol  for pain.  You can also get Voltaren gel for over-the-counter.   PLEASE BRING YOUR MEDICATIONS TO EVERY APPOINTMENT  Thank you, Dr. Tobie  If you have any other questions please contact the internal medicine clinic at [phone] If it is after hours, please call the [facility] at [phone] and then ask the person who picks up for the resident on call.

## 2024-09-10 NOTE — Assessment & Plan Note (Signed)
 Patient has a past medical history of hypertension.  She previously was on amlodipine  10 mg daily.  She has been not adherent to the medication blood pressure today is 138/67.  Will hold off on represcribing at this time given low nutrition.  Plan: - Hold off on amlodipine  given nonadherence and low nutrition

## 2024-09-11 ENCOUNTER — Ambulatory Visit: Payer: Self-pay | Admitting: Student

## 2024-09-11 LAB — URINE CYTOLOGY ANCILLARY ONLY
Chlamydia: NEGATIVE
Comment: NEGATIVE
Comment: NEGATIVE
Comment: NORMAL
Neisseria Gonorrhea: NEGATIVE
Trichomonas: NEGATIVE

## 2024-09-11 NOTE — Telephone Encounter (Signed)
 Copied from CRM #[id number]. Topic: Clinical - Lab/Test Results >> Sep 11, 2024 12:04 PM Julie Roberson ORN wrote: Reason for CRM: patient is request if  someone can go her  lab results,  Agent read the lab results per Julie Roberson , patient still like staff to call her to go over results

## 2024-09-11 NOTE — Progress Notes (Signed)
 Internal Medicine Clinic Attending  Case discussed with the resident at the time of the visit.  We reviewed the resident's history and exam and pertinent patient test results.  I agree with the assessment, diagnosis, and plan of care documented in the resident's note.

## 2024-09-12 LAB — CBC WITH DIFFERENTIAL/PLATELET
Basophils Absolute: 0 x10E3/uL (ref 0.0–0.2)
Basos: 0 %
EOS (ABSOLUTE): 0 x10E3/uL (ref 0.0–0.4)
Eos: 0 %
Hematocrit: 36.9 % (ref 34.0–46.6)
Hemoglobin: 12.4 g/dL (ref 11.1–15.9)
Immature Grans (Abs): 0 x10E3/uL (ref 0.0–0.1)
Immature Granulocytes: 0 %
Lymphocytes Absolute: 2.4 x10E3/uL (ref 0.7–3.1)
Lymphs: 28 %
MCH: 33.2 pg — ABNORMAL HIGH (ref 26.6–33.0)
MCHC: 33.6 g/dL (ref 31.5–35.7)
MCV: 99 fL — ABNORMAL HIGH (ref 79–97)
Monocytes Absolute: 0.4 x10E3/uL (ref 0.1–0.9)
Monocytes: 5 %
Neutrophils Absolute: 5.7 x10E3/uL (ref 1.4–7.0)
Neutrophils: 67 %
Platelets: 199 x10E3/uL (ref 150–450)
RBC: 3.74 x10E6/uL — ABNORMAL LOW (ref 3.77–5.28)
RDW: 12 % (ref 11.7–15.4)
WBC: 8.6 x10E3/uL (ref 3.4–10.8)

## 2024-09-12 LAB — COMPREHENSIVE METABOLIC PANEL WITH GFR
ALT: 52 IU/L — ABNORMAL HIGH (ref 0–32)
AST: 52 IU/L — ABNORMAL HIGH (ref 0–40)
Albumin: 3.2 g/dL — ABNORMAL LOW (ref 3.9–4.9)
Alkaline Phosphatase: 163 IU/L — ABNORMAL HIGH (ref 41–116)
BUN/Creatinine Ratio: 6 — ABNORMAL LOW (ref 9–23)
BUN: 2 mg/dL — ABNORMAL LOW (ref 6–24)
Bilirubin Total: 0.2 mg/dL (ref 0.0–1.2)
CO2: 16 mmol/L — ABNORMAL LOW (ref 20–29)
Calcium: 10.1 mg/dL (ref 8.7–10.2)
Chloride: 105 mmol/L (ref 96–106)
Creatinine, Ser: 0.33 mg/dL — ABNORMAL LOW (ref 0.57–1.00)
Globulin, Total: 3.2 g/dL (ref 1.5–4.5)
Glucose: 109 mg/dL — ABNORMAL HIGH (ref 70–99)
Potassium: 3.5 mmol/L (ref 3.5–5.2)
Sodium: 137 mmol/L (ref 134–144)
Total Protein: 6.4 g/dL (ref 6.0–8.5)
eGFR: 129 mL/min/1.73

## 2024-09-12 LAB — HIV ANTIBODY (ROUTINE TESTING W REFLEX): HIV Screen 4th Generation wRfx: NONREACTIVE

## 2024-09-12 LAB — TSH RFX ON ABNORMAL TO FREE T4: TSH: 2.85 u[IU]/mL (ref 0.450–4.500)

## 2024-09-12 LAB — CK: Total CK: 74 U/L (ref 32–182)

## 2024-09-12 LAB — C-REACTIVE PROTEIN: CRP: <1 mg/L (ref 0–10)

## 2024-09-12 LAB — SYPHILIS: RPR W/REFLEX TO RPR TITER AND TREPONEMAL ANTIBODIES, TRADITIONAL SCREENING AND DIAGNOSIS ALGORITHM: RPR Ser Ql: NONREACTIVE

## 2024-09-12 LAB — SEDIMENTATION RATE: Sed Rate: 32 mm/h (ref 0–32)

## 2024-09-18 LAB — VITAMIN D 1,25 DIHYDROXY
Vitamin D 1, 25 (OH)2 Total: 67 pg/mL — ABNORMAL HIGH
Vitamin D2 1, 25 (OH)2: <10 pg/mL
Vitamin D3 1, 25 (OH)2: 67 pg/mL

## 2024-09-18 LAB — VITAMIN B1

## 2024-09-18 LAB — FOLATE: Folate: 8.1 ng/mL

## 2024-09-18 LAB — VITAMIN B12: Vitamin B-12: 897 pg/mL (ref 232–1245)

## 2024-09-24 ENCOUNTER — Ambulatory Visit: Payer: Self-pay | Admitting: Student

## 2024-09-24 ENCOUNTER — Ambulatory Visit: Payer: Self-pay | Admitting: Licensed Clinical Social Worker

## 2024-09-24 NOTE — BH Specialist Note (Unsigned)
"     Integrated Behavioral Health via Telemedicine Visit  09/24/2024 Julie Roberson [id number]  Number of Integrated Behavioral Health Clinician visits: No data recorded Session Start time: 1530   Session End time: 1605  Total time in minutes: 35    Referring Provider: *** Patient/Family location: [facility] Provider location: *** All persons participating in visit: *** Types of Service: {CHL AMB TYPE OF SERVICE:[phone]}  I connected with Julie Roberson and/or Julie Roberson's {family members:20773} via  Telephone or Engineer, Civil (consulting)  (Video is Surveyor, mining) and verified that I am speaking with the correct person using two identifiers. Discussed confidentiality: {YES/NO:21197}  I discussed the limitations of telemedicine and the availability of in person appointments.  Discussed there is a possibility of technology failure and discussed alternative modes of communication if that failure occurs.  I discussed that engaging in this telemedicine visit, they consent to the provision of behavioral healthcare and the services will be billed under their insurance.  Patient and/or legal guardian expressed understanding and consented to Telemedicine visit: {YES/NO:21197}  Presenting Concerns: Patient and/or family reports the following symptoms/concerns: *** Duration of problem: ***; Severity of problem: {Mild/Moderate/Severe:20260}  Patient and/or Family's Strengths/Protective Factors: {CHL AMB BH PROTECTIVE FACTORS:[phone]}  Goals Addressed: Patient will:  Reduce symptoms of: {IBH Symptoms:21014056}   Increase knowledge and/or ability of: {IBH Patient Tools:21014057}   Demonstrate ability to: {IBH Goals:21014053}  Progress towards Goals: {CHL AMB BH PROGRESS TOWARDS GOALS:[id number]}    Interventions: Interventions utilized:  {IBH Interventions:21014054} Standardized Assessments completed: {IBH Screening Tools:21014051}    Patient and/or  Family Response: ***  Clinical Assessment/Diagnosis  No diagnosis found.    Assessment: Patient currently experiencing ***.   Patient may benefit from ***.  Plan: Follow up with behavioral health clinician on : *** Behavioral recommendations: *** Referral(s): {IBH Referrals:21014055}  I discussed the assessment and treatment plan with the patient and/or parent/guardian. They were provided an opportunity to ask questions and all were answered. They agreed with the plan and demonstrated an understanding of the instructions.   They were advised to call back or seek an in-person evaluation if the symptoms worsen or if the condition fails to improve as anticipated.  Julie Roberson "

## 2024-09-25 ENCOUNTER — Other Ambulatory Visit: Payer: Self-pay | Admitting: Student

## 2024-09-25 DIAGNOSIS — Z1231 Encounter for screening mammogram for malignant neoplasm of breast: Secondary | ICD-10-CM

## 2024-09-29 ENCOUNTER — Ambulatory Visit

## 2024-09-30 ENCOUNTER — Ambulatory Visit: Admitting: Student

## 2024-10-02 ENCOUNTER — Ambulatory Visit: Payer: Self-pay | Admitting: Licensed Clinical Social Worker

## 2024-11-06 ENCOUNTER — Ambulatory Visit

## 2024-11-26 ENCOUNTER — Ambulatory Visit (HOSPITAL_COMMUNITY): Admitting: Psychiatry
